# Patient Record
Sex: Male | Born: 1975 | Race: White | Hispanic: No | Marital: Single | Smoking: Current every day smoker
Health system: Southern US, Community
[De-identification: ages and names within clinical notes are randomized; demographics above are authoritative.]

## PROBLEM LIST (undated history)

## (undated) HISTORY — PX: WISDOM TOOTH EXTRACTION: SHX21

---

## 2016-06-16 ENCOUNTER — Emergency Department (HOSPITAL_COMMUNITY)
Admission: EM | Admit: 2016-06-16 | Discharge: 2016-06-17 | Disposition: A | Discharge status: Home / Self Care | Payer: Self-pay | Attending: Emergency Medicine | Admitting: Emergency Medicine

## 2016-06-16 ENCOUNTER — Encounter (HOSPITAL_COMMUNITY): Payer: Self-pay | Admitting: Emergency Medicine

## 2016-06-16 DIAGNOSIS — R229 Localized swelling, mass and lump, unspecified: Secondary | ICD-10-CM

## 2016-06-16 DIAGNOSIS — IMO0002 Reserved for concepts with insufficient information to code with codable children: Secondary | ICD-10-CM

## 2016-06-16 DIAGNOSIS — Z79899 Other long term (current) drug therapy: Secondary | ICD-10-CM | POA: Insufficient documentation

## 2016-06-16 DIAGNOSIS — N3001 Acute cystitis with hematuria: Secondary | ICD-10-CM | POA: Insufficient documentation

## 2016-06-16 DIAGNOSIS — N4400 Torsion of testis, unspecified: Secondary | ICD-10-CM | POA: Insufficient documentation

## 2016-06-16 DIAGNOSIS — F17200 Nicotine dependence, unspecified, uncomplicated: Secondary | ICD-10-CM | POA: Insufficient documentation

## 2016-06-16 DIAGNOSIS — N451 Epididymitis: Secondary | ICD-10-CM | POA: Insufficient documentation

## 2016-06-16 MED ORDER — SODIUM CHLORIDE 0.9 % IV SOLN
INTRAVENOUS | Status: DC
  Administered 2016-06-17: 03:00:00 via INTRAVENOUS

## 2016-06-16 MED ORDER — HYDROMORPHONE HCL 2 MG/ML IJ SOLN
1.0000 mg | Freq: Once | INTRAMUSCULAR | Status: AC
  Administered 2016-06-17: 1 mg via INTRAVENOUS
  Filled 2016-06-16: qty 1

## 2016-06-16 NOTE — ED Provider Notes (Signed)
TIME SEEN:  By signing my name below, I, Scott Carrillo, attest that this documentation has been prepared under the direction and in the presence of [employer]bridge EnergyKristen N Shed Nixon, DO.  Electronically Signed: Octavia HeirArianna Carrillo, ED Scribe. 06/17/16. 12:16 AM.   CHIEF COMPLAINT:  Chief Complaint  Patient presents with  . Groin Swelling  . Testicle Pain     HPI:  HPI Comments: Scott Carrillo is a 40 y.o. male brought in by ambulance, who presents to the Emergency Department complaining of gradual worsening, moderate, right testicular pain and swelling x 3 days with gradual worsening symptoms for the past 48 hours. Pt reports associated loss of appetite and mild diarrhea. He has right testicular erythema and hardening. Pt notes no pain, swelling, or erythema to his right testicle.  No injury to this area. He reports having slight pain Monday and ~ 6 pm he noticed the swelling. He says taking a hot bath yesterday which eased his pain minimally enough for him to go to sleep. Pt was [facility] at [facility], told he may possibly have an incarcerated hernia, and [facility]. Pt has no abdominal surgical hx. He denies fever, chills, nausea, or vomiting.   ROS: See HPI Constitutional: no fever  Eyes: no drainage  ENT: no runny nose   Cardiovascular:  no chest pain  Resp: no SOB  GI: no vomiting GU: no dysuria Integumentary: no rash  Allergy: no hives  Musculoskeletal: no leg swelling  Neurological: no slurred speech ROS otherwise negative  PAST MEDICAL HISTORY/PAST SURGICAL HISTORY:  History reviewed. No pertinent past medical history.  MEDICATIONS:  Prior to Admission medications   Not on File    ALLERGIES:  Allergies  Allergen Reactions  . Penicillins     Anaphylaxis     SOCIAL HISTORY:  Social History  Substance Use Topics  . Smoking status: Current Every Day Smoker    Packs/day: 1.00  . Smokeless tobacco: Never Used  . Alcohol use Not on file    FAMILY HISTORY: No family  history on file.  EXAM: BP 120/77 (BP Location: Left Arm)   Pulse 80   Temp 98.1 F (36.7 C) (Oral)   Resp 16   SpO2 99%  CONSTITUTIONAL: Alert and oriented and responds appropriately to questions. Well-appearing; well-nourished HEAD: Normocephalic EYES: Conjunctivae clear, PERRL, EOMI ENT: normal nose; no rhinorrhea; moist mucous membranes NECK: Supple, no meningismus, no nuchal rigidity, no LAD  CARD: RRR; S1 and S2 appreciated; no murmurs, no clicks, no rubs, no gallops RESP: Normal chest excursion without splinting or tachypnea; breath sounds clear and equal bilaterally; no wheezes, no rhonchi, no rales, no hypoxia or respiratory distress, speaking full sentences ABD/GI: Normal bowel sounds; non-distended; soft, non-tender, no rebound, no guarding, no peritoneal signs, no hepatosplenomegaly GU:  Normal external genitalia, circumcised male, normal penile shaft, no blood or discharge at the urethral meatus.  Patient has right testicular swelling with tenderness and a high riding testicle on the right side with no cremasteric reflex present. There is no left testicular masses or tenderness on exam, no left scrotal masses or swelling, no hernias appreciated, 2+ femoral pulses bilaterally; no perineal erythema, warmth, subcutaneous air or crepitus; no high riding testicle, normal left cremasteric reflex.  Chaperone present for exam. BACK:  The back appears normal and is non-tender to palpation, there is no CVA tenderness EXT: Normal ROM in all joints; non-tender to palpation; no edema; normal capillary refill; no cyanosis, no calf tenderness or swelling    SKIN:  Normal color for age and race; warm; no rash NEURO: Moves all extremities equally, sensation to light touch intact diffusely, cranial nerves II through XII intact, normal speech PSYCH: The patient's mood and manner are appropriate. Grooming and personal hygiene are appropriate.  MEDICAL DECISION MAKING: I am concerned for testicular  torsion given patient's clinical exam. At this time I do not appreciate a hernia. Nothing to suggest Fournier's gangrene or necrotizing fasciitis. He is 48 hours out from onset of symptoms. Discussed with him that if this is testicular torsion that this testicle is likely nonsalvageable. He understands. We will keep him NPO, give IV fluids, pain medication. Will obtain a scrotal ultrasound with Doppler. Labs at outside hospital showed leukocytosis of 14,000 but otherwise unremarkable.  ED PROGRESS: Patient's labs show leukocytosis with left shift. His ultrasound shows no torsion but rather epididymitis. No bowel loops noted in the scrotal sac. He is sexually active. Denies history of previous STDs. Will send urinalysis, urine culture. We'll also obtain GC and chlamydia swab.   Urine shows blood, leukocytes and bacteria. Urine culture is pending. Will discharge on Keflex and doxycycline. We'll discharge with pain medication. Given outpatient urology follow-up. Patient reports he is from [name].   3:34 AM Pt states he is not feeling much pain currently. Pt is comfortable with plan and is stable for discharge at this time. Return precautions for return into the ED were discussed.     At this time, I do not feel there is any life-threatening condition present. I have reviewed and discussed all results (EKG, imaging, lab, urine as appropriate) and exam findings with patient/family. I have reviewed nursing notes and appropriate previous records.  I feel the patient is safe to be discharged home without further emergent workup and can continue workup as an outpatient as needed. Discussed usual and customary return precautions. Patient/family verbalize understanding and are comfortable with this plan.  Outpatient follow-up has been provided. All questions have been answered.  I personally performed the services described in this documentation, which was scribed in my presence. The recorded information has been  reviewed and is accurate.    Scott MawKristen N Deborrah Mabin, DO 06/17/16 [phone]

## 2016-06-16 NOTE — ED Triage Notes (Signed)
Pt started having scrotal swelling and pain on Monday. Relieved with hot baths. Then this morning felt "10 time worse".

## 2016-06-16 NOTE — ED Triage Notes (Signed)
Was seen at [facility] and referred here

## 2016-06-17 ENCOUNTER — Emergency Department (HOSPITAL_COMMUNITY): Payer: Self-pay

## 2016-06-17 LAB — URINALYSIS, ROUTINE W REFLEX MICROSCOPIC
Bilirubin Urine: NEGATIVE
GLUCOSE, UA: NEGATIVE mg/dL
KETONES UR: 15 mg/dL — AB
NITRITE: NEGATIVE
PROTEIN: 30 mg/dL — AB
Specific Gravity, Urine: 1.019 (ref 1.005–1.030)
pH: 5.5 (ref 5.0–8.0)

## 2016-06-17 LAB — BASIC METABOLIC PANEL
ANION GAP: 6 (ref 5–15)
BUN: 6 mg/dL (ref 6–20)
CALCIUM: 8.8 mg/dL — AB (ref 8.9–10.3)
CO2: 21 mmol/L — AB (ref 22–32)
Chloride: 107 mmol/L (ref 101–111)
Creatinine, Ser: 0.84 mg/dL (ref 0.61–1.24)
GLUCOSE: 104 mg/dL — AB (ref 65–99)
POTASSIUM: 3.4 mmol/L — AB (ref 3.5–5.1)
Sodium: 134 mmol/L — ABNORMAL LOW (ref 135–145)

## 2016-06-17 LAB — CBC WITH DIFFERENTIAL/PLATELET
BASOS ABS: 0 10*3/uL (ref 0.0–0.1)
BASOS PCT: 0 %
Eosinophils Absolute: 0 10*3/uL (ref 0.0–0.7)
Eosinophils Relative: 0 %
HEMATOCRIT: 39.8 % (ref 39.0–52.0)
Hemoglobin: 13.4 g/dL (ref 13.0–17.0)
LYMPHS PCT: 22 %
Lymphs Abs: 2.9 10*3/uL (ref 0.7–4.0)
MCH: 30.5 pg (ref 26.0–34.0)
MCHC: 33.7 g/dL (ref 30.0–36.0)
MCV: 90.7 fL (ref 78.0–100.0)
Monocytes Absolute: 1.4 10*3/uL — ABNORMAL HIGH (ref 0.1–1.0)
Monocytes Relative: 11 %
NEUTROS ABS: 8.7 10*3/uL — AB (ref 1.7–7.7)
NEUTROS PCT: 67 %
Platelets: 229 10*3/uL (ref 150–400)
RBC: 4.39 MIL/uL (ref 4.22–5.81)
RDW: 13.3 % (ref 11.5–15.5)
WBC: 13.1 10*3/uL — AB (ref 4.0–10.5)

## 2016-06-17 LAB — GC/CHLAMYDIA PROBE AMP (~~LOC~~) NOT AT ARMC
Chlamydia: POSITIVE — AB
NEISSERIA GONORRHEA: NEGATIVE

## 2016-06-17 LAB — URINE MICROSCOPIC-ADD ON

## 2016-06-17 MED ORDER — DOXYCYCLINE HYCLATE 100 MG PO TABS
100.0000 mg | ORAL_TABLET | Freq: Once | ORAL | Status: AC
  Administered 2016-06-17: 100 mg via ORAL
  Filled 2016-06-17: qty 1

## 2016-06-17 MED ORDER — OXYCODONE-ACETAMINOPHEN 5-325 MG PO TABS
1.0000 | ORAL_TABLET | ORAL | 0 refills | Status: DC | PRN
Start: 2016-06-17 — End: 2021-03-20

## 2016-06-17 MED ORDER — HYDROMORPHONE HCL 2 MG/ML IJ SOLN
1.0000 mg | Freq: Once | INTRAMUSCULAR | Status: AC
  Administered 2016-06-17: 1 mg via INTRAVENOUS
  Filled 2016-06-17: qty 1

## 2016-06-17 MED ORDER — PROMETHAZINE HCL 25 MG PO TABS: 25.0000 mg | ORAL_TABLET | Freq: Four times a day (QID) | ORAL | 0 refills | Status: DC | PRN

## 2016-06-17 MED ORDER — CEPHALEXIN 500 MG PO CAPS: 500.0000 mg | ORAL_CAPSULE | Freq: Two times a day (BID) | ORAL | 0 refills | Status: DC

## 2016-06-17 MED ORDER — DOXYCYCLINE HYCLATE 100 MG PO CAPS: 100.0000 mg | ORAL_CAPSULE | Freq: Two times a day (BID) | ORAL | 0 refills | Status: DC

## 2016-06-17 MED ORDER — DEXTROSE 5 % IV SOLN
1.0000 g | Freq: Once | INTRAVENOUS | Status: AC
  Administered 2016-06-17: 1 g via INTRAVENOUS
  Filled 2016-06-17: qty 10

## 2016-06-18 LAB — URINE CULTURE: Culture: <10000 — AB

## 2017-07-21 ENCOUNTER — Encounter: Payer: Self-pay | Admitting: Emergency Medicine

## 2017-07-21 ENCOUNTER — Emergency Department: Payer: Self-pay

## 2017-07-21 ENCOUNTER — Emergency Department
Admission: EM | Admit: 2017-07-21 | Discharge: 2017-07-21 | Disposition: A | Discharge status: Home / Self Care | Payer: Self-pay | Attending: Emergency Medicine | Admitting: Emergency Medicine

## 2017-07-21 DIAGNOSIS — F17210 Nicotine dependence, cigarettes, uncomplicated: Secondary | ICD-10-CM | POA: Insufficient documentation

## 2017-07-21 DIAGNOSIS — R05 Cough: Secondary | ICD-10-CM | POA: Insufficient documentation

## 2017-07-21 DIAGNOSIS — N390 Urinary tract infection, site not specified: Secondary | ICD-10-CM | POA: Insufficient documentation

## 2017-07-21 DIAGNOSIS — R509 Fever, unspecified: Secondary | ICD-10-CM | POA: Insufficient documentation

## 2017-07-21 DIAGNOSIS — M545 Low back pain: Secondary | ICD-10-CM | POA: Insufficient documentation

## 2017-07-21 LAB — URINALYSIS, COMPLETE (UACMP) WITH MICROSCOPIC
Bilirubin Urine: NEGATIVE
GLUCOSE, UA: NEGATIVE mg/dL
Hgb urine dipstick: NEGATIVE
Ketones, ur: NEGATIVE mg/dL
Nitrite: NEGATIVE
Protein, ur: 30 mg/dL — AB
SPECIFIC GRAVITY, URINE: 1.023 (ref 1.005–1.030)
pH: 6 (ref 5.0–8.0)

## 2017-07-21 LAB — BASIC METABOLIC PANEL
ANION GAP: 9 (ref 5–15)
BUN: 7 mg/dL (ref 6–20)
CO2: 22 mmol/L (ref 22–32)
Calcium: 9.4 mg/dL (ref 8.9–10.3)
Chloride: 107 mmol/L (ref 101–111)
Creatinine, Ser: 0.81 mg/dL (ref 0.61–1.24)
GFR calc non Af Amer: >60 mL/min (ref >60)
GLUCOSE: 116 mg/dL — AB (ref 65–99)
POTASSIUM: 4.3 mmol/L (ref 3.5–5.1)
Sodium: 138 mmol/L (ref 135–145)

## 2017-07-21 LAB — CBC
HEMATOCRIT: 46.3 % (ref 40.0–52.0)
HEMOGLOBIN: 15.7 g/dL (ref 13.0–18.0)
MCH: 30.7 pg (ref 26.0–34.0)
MCHC: 33.9 g/dL (ref 32.0–36.0)
MCV: 90.6 fL (ref 80.0–100.0)
Platelets: 232 10*3/uL (ref 150–440)
RBC: 5.11 MIL/uL (ref 4.40–5.90)
RDW: 13.9 % (ref 11.5–14.5)
WBC: 5.7 10*3/uL (ref 3.8–10.6)

## 2017-07-21 LAB — CHLAMYDIA/NGC RT PCR (ARMC ONLY)
CHLAMYDIA TR: NOT DETECTED
N gonorrhoeae: NOT DETECTED

## 2017-07-21 MED ORDER — LEVOFLOXACIN 500 MG PO TABS: 500.0000 mg | ORAL_TABLET | Freq: Every day | ORAL | 0 refills | Status: AC

## 2017-07-21 MED ORDER — AZITHROMYCIN 500 MG PO TABS
1000.0000 mg | ORAL_TABLET | Freq: Once | ORAL | Status: AC
  Administered 2017-07-21: 1000 mg via ORAL
  Filled 2017-07-21: qty 2

## 2017-07-21 NOTE — Discharge Instructions (Signed)
As we discussed please have any sexual partners tested and treated if the results are positive.  Please take your antibiotics as prescribed for the next 7 days.  Return to the emergency department for any significant worsening of pain, vomiting unable to keep down her antibiotics or development of fever.

## 2017-07-21 NOTE — ED Triage Notes (Addendum)
Pt comes into the ED via POV c/o cough, congestion, and body aches that have been going on for about a week and half.  Denies any chest pain, N/V, or dizziness.  Patient denies getting a flu shot this year.  Patient states the highest his fever at home was 103.  Patient has even and unlabored respirations and is ambulatory to triage at this time.  Patient also states he has lower back pain that is causing difficulty urinating.  Patient believes he has a kidney infection.

## 2017-07-21 NOTE — ED Provider Notes (Signed)
[facility] Emergency Department Provider Note  Time seen: 2:52 PM  I have reviewed the triage vital signs and the nursing notes.   HISTORY  Chief Complaint Cough and Back Pain    HPI Scott Carrillo is a 41 y.o. male with no past medical history who presents to the emergency department for 1 week of cough, congestion, fever now with lower back pain and dysuria.  According to the patient for the past 1 week he has been expensing cough, congestion fever at times at home.  He states over the past 3 days he is also been experiencing dysuria with mild penile discharge.  States he has experienced mild to moderate lower back pain as well.  Patient states his pain is minimal at this time.  Denies any vomiting or diarrhea.  Largely negative review of systems otherwise.  History reviewed. No pertinent past medical history.  There are no active problems to display for this patient.   Past Surgical History:  Procedure Laterality Date  . WISDOM TOOTH EXTRACTION      Prior to Admission medications   Medication Sig Start Date End Date Taking? Authorizing Provider  cephALEXin (KEFLEX) 500 MG capsule Take 1 capsule (500 mg total) by mouth 2 (two) times daily. 06/17/16   Ward, Layla Maw, DO  doxycycline (VIBRAMYCIN) 100 MG capsule Take 1 capsule (100 mg total) by mouth 2 (two) times daily. 06/17/16   Ward, Layla Maw, DO  oxyCODONE-acetaminophen (PERCOCET/ROXICET) 5-325 MG tablet Take 1 tablet by mouth every 4 (four) hours as needed. 06/17/16   Ward, Layla Maw, DO  promethazine (PHENERGAN) 25 MG tablet Take 1 tablet (25 mg total) by mouth every 6 (six) hours as needed for nausea or vomiting. 06/17/16   Ward, Layla Maw, DO    Allergies  Allergen Reactions  . Penicillins     Anaphylaxis     No family history on file.  Social History Social History   Tobacco Use  . Smoking status: Current Every Day Smoker    Packs/day: 1.00  . Smokeless tobacco: Never Used  Substance  Use Topics  . Alcohol use: No    Frequency: Never  . Drug use: Yes    Types: Marijuana    Comment: rare    Review of Systems Constitutional: Positive for fever over the past 1 week per patient. Eyes: Negative for visual complaints. ENT: Mild congestion Cardiovascular: Negative for chest pain. Respiratory: Negative for shortness of breath.  Positive for cough Gastrointestinal: Negative for abdominal pain, vomiting and diarrhea. Genitourinary: Positive for dysuria with mild penile discharge. Musculoskeletal: Mild to moderate lower back pain bilaterally. Skin: Negative for rash. Neurological: Negative for headache All other ROS negative  ____________________________________________   PHYSICAL EXAM:  VITAL SIGNS: ED Triage Vitals  Enc Vitals Group     BP 07/21/17 1238 125/63     Pulse Rate 07/21/17 1238 75     Resp 07/21/17 1238 18     Temp 07/21/17 1238 98.4 F (36.9 C)     Temp Source 07/21/17 1238 Oral     SpO2 07/21/17 1238 97 %     Weight 07/21/17 1139 160 lb (72.6 kg)     Height 07/21/17 1139 5\' 3"  (1.6 m)     Head Circumference --      Peak Flow --      Pain Score 07/21/17 1139 9     Pain Loc --      Pain Edu? --      Excl.  in GC? --     Constitutional: Alert and oriented. Well appearing and in no distress. Eyes: Normal exam ENT   Head: Normocephalic and atraumatic   Mouth/Throat: Mucous membranes are moist. Cardiovascular: Normal rate, regular rhythm. No murmur Respiratory: Normal respiratory effort without tachypnea nor retractions. Breath sounds are clear  Gastrointestinal: Soft and nontender. No distention.   Musculoskeletal: Nontender with normal range of motion in all extremities. Neurologic:  Normal speech and language. No gross focal neurologic deficits  Skin:  Skin is warm, dry and intact.  Psychiatric: Mood and affect are normal.   ____________________________________________    RADIOLOGY  X-ray  negative  ____________________________________________   INITIAL IMPRESSION / ASSESSMENT AND PLAN / ED COURSE  Pertinent labs & imaging results that were available during my care of the patient were reviewed by me and considered in my medical decision making (see chart for details).  Patient presents to the emergency department for 1 week of cough, congestion, fever now with 3 days of dysuria penile discharge lower back pain.  Differential would include upper respiratory infection, viral illness, influenza, urinary tract infection, STD, pyelonephritis.  Patient's labs have resulted showing a significant urinary tract infection with too numerous to count white blood cells as well as many bacteria.  Patient states proximate 6 months ago he was diagnosed with an STD.  Record review Shows he was diagnosed with chlamydia.  We will cover with azithromycin in the emergency department, 1 g.  Patient agreeable to plan.  Patient has an anaphylactic penicillin allergy we will hold off on coverage for gonorrhea until we know the results of his STD testing.  I discussed with the patient the need to have his partner tested and treated as well and have 7 days elapsed once both partners are treated before any sexual activity.  Patient agreeable.  I also discussed given the patient's complaints of back pain into numerous to count white cells we obtain an IV for antibiotics.  Patient states unfortunately he needs to leave the emergency department to pick up his girlfriend.  We will cover with Levaquin to cover for possible urinary tract infection in addition to STD testing.  Patient agreeable to plan of care.  ____________________________________________   FINAL CLINICAL IMPRESSION(S) / ED DIAGNOSES  Urinary tract infection    Minna AntisPaduchowski, Norina Cowper, MD 07/21/17 1455

## 2017-07-22 NOTE — ED Notes (Addendum)
Patient called patient experience to express dissatisfaction R/T cost of Levaquin that prescribed at last visit.  Reviewed patient's record with Dr. Pershing ProudSchaevitz and Macrobid 100 mg PO BID #14 ordered.  Patient called.  Apologized for inconvenience.  Explained to patient that although I was unsure as to actual cost of Macrobid, I had found information that a typical RX was around $20 cost.  Teaching given r/t taking Macrobid.  Understanding verbalized.  Patient stated he was satisfied with resolution.  [facility] in [place] ([phone]) called and Macrobid RX called in.

## 2017-07-23 LAB — URINE CULTURE

## 2018-07-10 IMAGING — CR DG CHEST 2V
1 series · 2 of 2 positions shown · non-contrast
Comparison: None.

CLINICAL DATA: Productive cough

EXAM:
CHEST  2 VIEW

[Series 1: dg chest 2 view · 0.14mm/px · 2 of 2 slices shown]
[im 1/2]
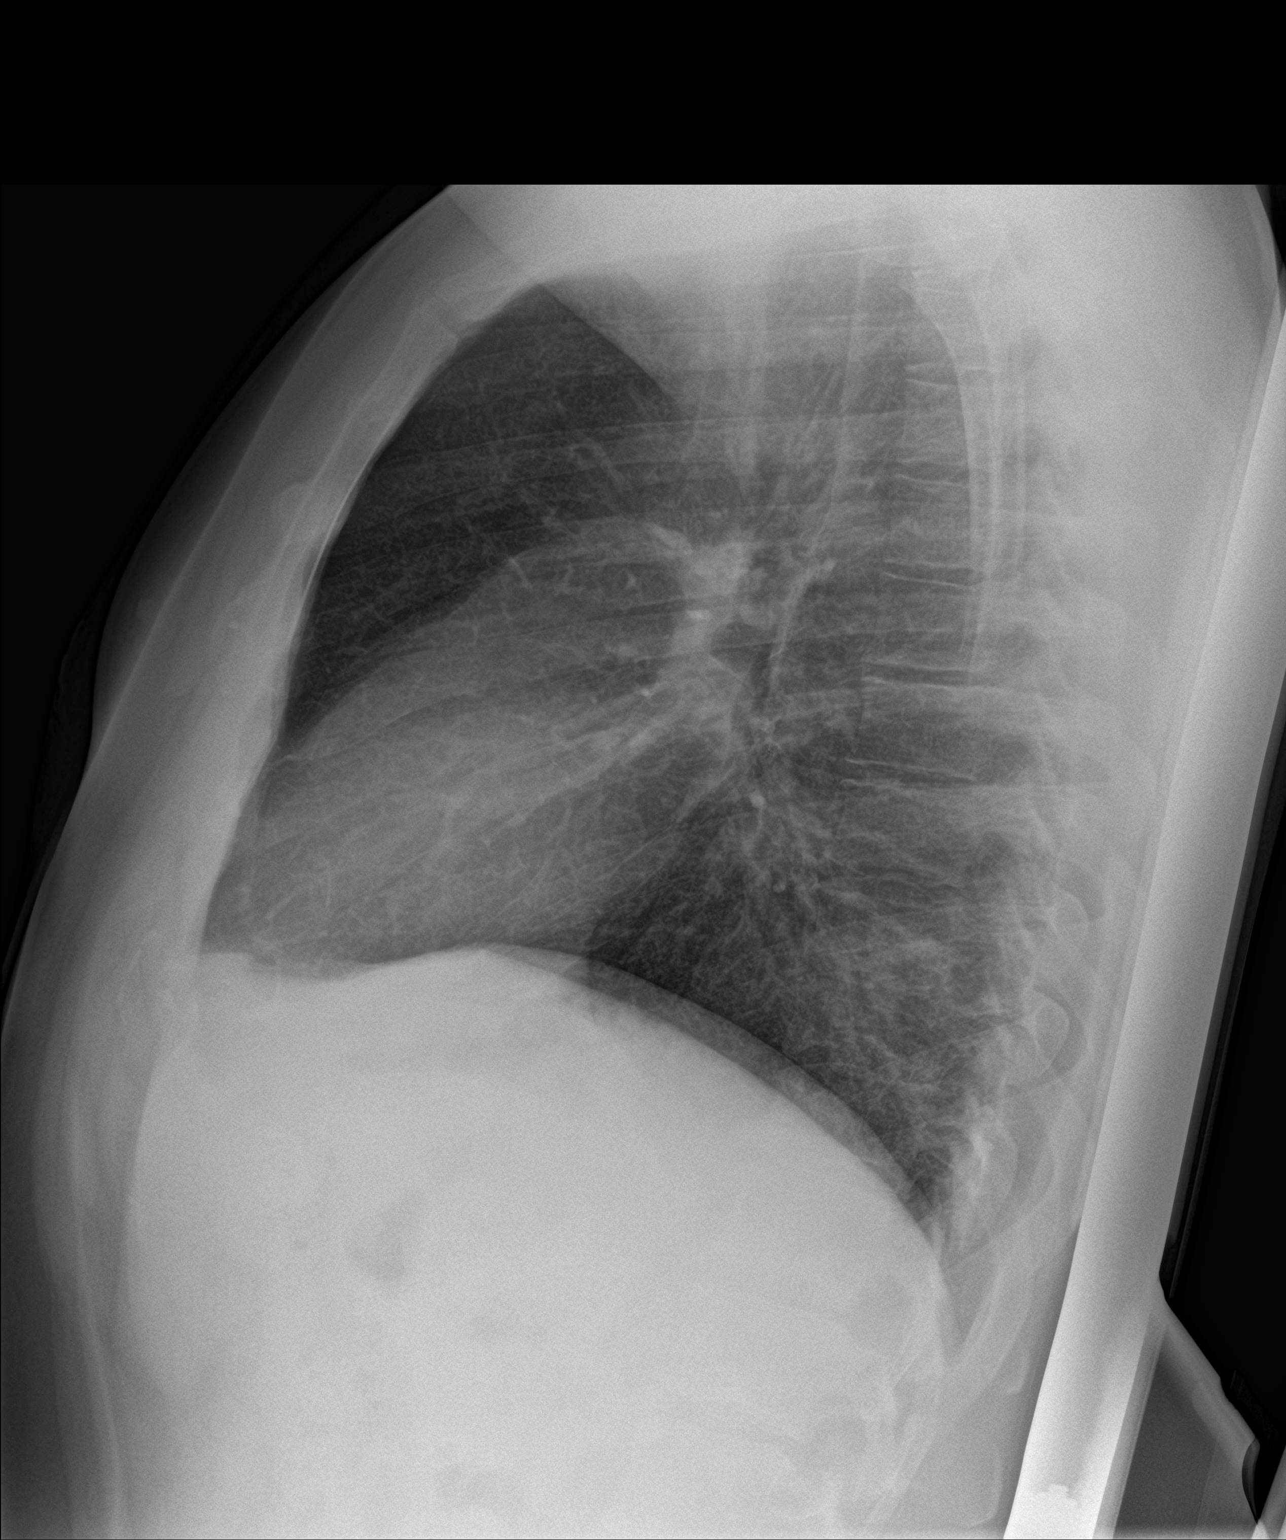
[im 2/2]
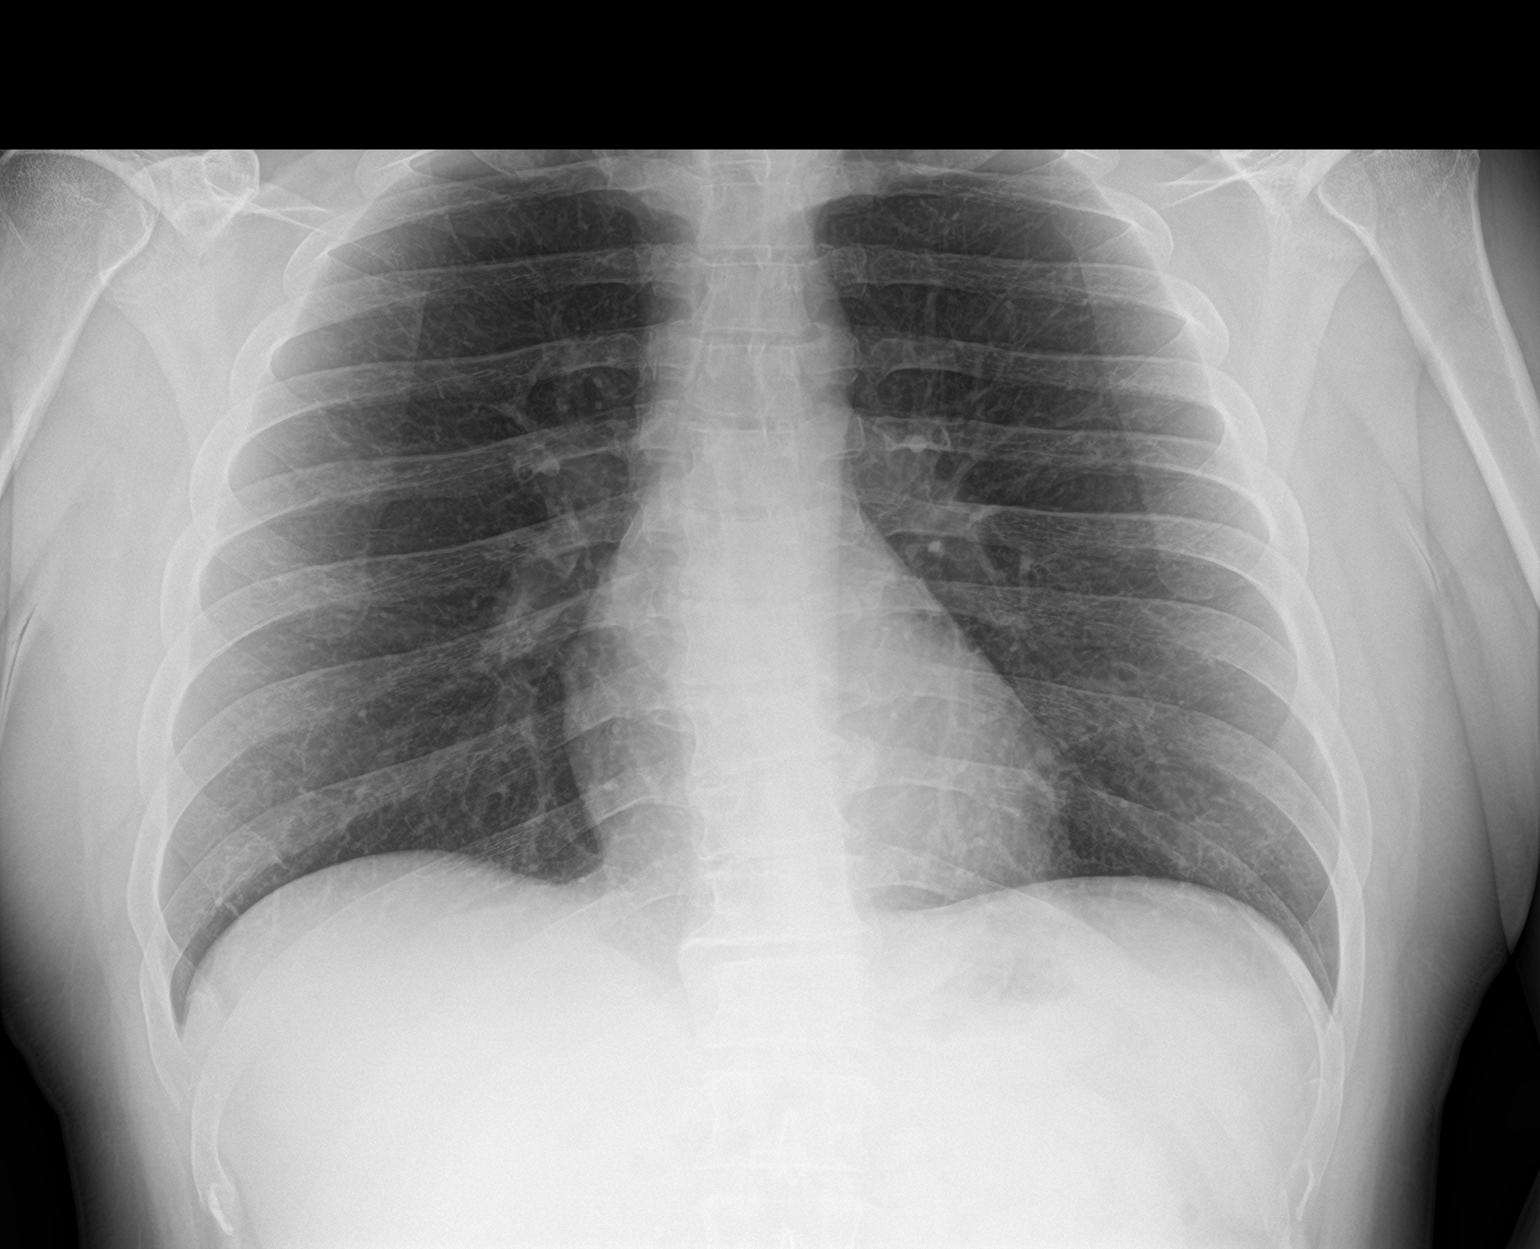

[2 of 2 positions shown; findings below may reference images not displayed]

FINDINGS: Heart and mediastinal contours are within normal limits. No focal
opacities or effusions. No acute bony abnormality.
IMPRESSION: No active cardiopulmonary disease.

## 2021-03-20 ENCOUNTER — Other Ambulatory Visit: Payer: Self-pay

## 2021-03-20 ENCOUNTER — Emergency Department (HOSPITAL_COMMUNITY)
Admission: EM | Admit: 2021-03-20 | Discharge: 2021-03-20 | Disposition: A | Discharge status: Home / Self Care | Payer: Self-pay | Attending: Emergency Medicine | Admitting: Emergency Medicine

## 2021-03-20 ENCOUNTER — Encounter (HOSPITAL_COMMUNITY): Payer: Self-pay

## 2021-03-20 DIAGNOSIS — K047 Periapical abscess without sinus: Secondary | ICD-10-CM | POA: Insufficient documentation

## 2021-03-20 DIAGNOSIS — F17210 Nicotine dependence, cigarettes, uncomplicated: Secondary | ICD-10-CM | POA: Insufficient documentation

## 2021-03-20 MED ORDER — MELOXICAM 15 MG PO TABS: 15.0000 mg | ORAL_TABLET | Freq: Every day | ORAL | 0 refills | Status: AC

## 2021-03-20 MED ORDER — CLINDAMYCIN HCL 300 MG PO CAPS: 300.0000 mg | ORAL_CAPSULE | Freq: Four times a day (QID) | ORAL | 0 refills | Status: AC

## 2021-03-20 NOTE — Discharge Instructions (Addendum)
Rinse with listerine after every meal. Take antibiotics as prescribed and complete the full course. Take pain medication as needed as prescribed. Follow up with a dentist as soon as possible.

## 2021-03-20 NOTE — ED Triage Notes (Signed)
Pt reports left upper dental pain and abscess. Pt states he has 2 chipped teeth in that area, using warm compresses for swelling.

## 2021-03-20 NOTE — ED Provider Notes (Signed)
[name] EMERGENCY DEPARTMENT Provider Note   CSN: [id number] Arrival date & time: 03/20/21  9211     History Chief Complaint  Patient presents with   Abscess    Scott Carrillo is a 45 y.o. male.  45 year old male presents with concern for dental abscess.  Patient reports having a tooth that was going bad in his left upper mouth, 8 croutons at a restaurant a few days ago which cracked his teeth, and now with pain and swelling to the left upper jaw with swelling extending below his left eye.  Denies fevers, chills, trauma, drainage.  No other complaints or concerns.  Patient tried calling a dentist today however due to today being Friday and a holiday weekend is unable to be seen.      History reviewed. No pertinent past medical history.  There are no problems to display for this patient.   Past Surgical History:  Procedure Laterality Date   WISDOM TOOTH EXTRACTION         No family history on file.  Social History   Tobacco Use   Smoking status: Every Day    Packs/day: 1.00    Types: Cigarettes   Smokeless tobacco: Never  Substance Use Topics   Alcohol use: No   Drug use: Yes    Types: Marijuana    Comment: rare    Home Medications Prior to Admission medications   Medication Sig Start Date End Date Taking? Authorizing Provider  clindamycin (CLEOCIN) 300 MG capsule Take 1 capsule (300 mg total) by mouth 4 (four) times daily for 10 days. 03/20/21 03/30/21 Yes Jeannie Fend, PA-C  meloxicam (MOBIC) 15 MG tablet Take 1 tablet (15 mg total) by mouth daily for 10 days. 03/20/21 03/30/21 Yes Jeannie Fend, PA-C    Allergies    Penicillins  Review of Systems   Review of Systems  Constitutional:  Negative for fever.  HENT:  Positive for dental problem and facial swelling. Negative for ear pain, trouble swallowing and voice change.   Gastrointestinal:  Negative for nausea and vomiting.  Musculoskeletal:  Negative for neck pain.  Skin:  Negative  for rash and wound.  Allergic/Immunologic: Negative for immunocompromised state.  Neurological:  Negative for headaches.  Hematological:  Negative for adenopathy.  Psychiatric/Behavioral:  Negative for confusion.   All other systems reviewed and are negative.  Physical Exam Updated Vital Signs BP (!) 142/87   Pulse 91   Temp 98.3 F (36.8 C) (Oral)   Resp 16   Ht 5\' 3"  (1.6 m)   Wt 80.3 kg   SpO2 99%   BMI 31.35 kg/m   Physical Exam Vitals and nursing note reviewed.  Constitutional:      General: He is not in acute distress.    Appearance: He is well-developed. He is not diaphoretic.  HENT:     Head: Atraumatic.     Jaw: No trismus.      Nose: Nose normal.     Mouth/Throat:     Mouth: Mucous membranes are moist.     Dentition: Gingival swelling and dental abscesses present.   Eyes:     Extraocular Movements: Extraocular movements intact.     Conjunctiva/sclera: Conjunctivae normal.     Pupils: Pupils are equal, round, and reactive to light.  Pulmonary:     Effort: Pulmonary effort is normal.  Musculoskeletal:     Cervical back: Neck supple.  Lymphadenopathy:     Cervical: No cervical adenopathy.  Skin:  General: Skin is warm and dry.     Findings: No erythema or rash.  Neurological:     Mental Status: He is alert and oriented to person, place, and time.  Psychiatric:        Behavior: Behavior normal.    ED Results / Procedures / Treatments   Labs (all labs ordered are listed, but only abnormal results are displayed) Labs Reviewed - No data to display  EKG None  Radiology No results found.  Procedures Procedures   Medications Ordered in ED Medications - No data to display  ED Course  I have reviewed the triage vital signs and the nursing notes.  Pertinent labs & imaging results that were available during my care of the patient were reviewed by me and considered in my medical decision making (see chart for details).  Clinical Course as of  03/20/21 1050  Fri Mar 20, 2021  1049 45 yo male with left upper dental abscess as above. No trismus, no facial cellulitis, no drainable collection present today.  Recommend warm compresses, antibiotics, listerine rise. Follow up with dentist as soon as possible, given referral as well as resource list. [LM]    Clinical Course User Index [LM] Alden Hipp   MDM Rules/Calculators/A&P                           Final Clinical Impression(s) / ED Diagnoses Final diagnoses:  Dental abscess    Rx / DC Orders ED Discharge Orders          Ordered    clindamycin (CLEOCIN) 300 MG capsule  4 times daily        03/20/21 1014    meloxicam (MOBIC) 15 MG tablet  Daily        03/20/21 1014             Alden Hipp 03/20/21 1050    Terald Sleeper, MD 03/20/21 1341

## 2021-09-08 ENCOUNTER — Other Ambulatory Visit: Payer: Self-pay

## 2021-09-08 ENCOUNTER — Emergency Department (HOSPITAL_COMMUNITY)
Admission: EM | Admit: 2021-09-08 | Discharge: 2021-09-09 | Disposition: A | Discharge status: Home / Self Care | Payer: Self-pay | Attending: Emergency Medicine | Admitting: Emergency Medicine

## 2021-09-08 ENCOUNTER — Emergency Department (HOSPITAL_COMMUNITY): Payer: Self-pay

## 2021-09-08 ENCOUNTER — Encounter (HOSPITAL_COMMUNITY): Payer: Self-pay | Admitting: Emergency Medicine

## 2021-09-08 DIAGNOSIS — M549 Dorsalgia, unspecified: Secondary | ICD-10-CM | POA: Insufficient documentation

## 2021-09-08 DIAGNOSIS — R0789 Other chest pain: Secondary | ICD-10-CM | POA: Insufficient documentation

## 2021-09-08 LAB — CBC WITH DIFFERENTIAL/PLATELET
Abs Immature Granulocytes: 0.07 10*3/uL (ref 0.00–0.07)
Basophils Absolute: 0.1 10*3/uL (ref 0.0–0.1)
Basophils Relative: 1 %
Eosinophils Absolute: 0.1 10*3/uL (ref 0.0–0.5)
Eosinophils Relative: 2 %
HCT: 47.3 % (ref 39.0–52.0)
Hemoglobin: 15.9 g/dL (ref 13.0–17.0)
Immature Granulocytes: 1 %
Lymphocytes Relative: 43 %
Lymphs Abs: 3.4 10*3/uL (ref 0.7–4.0)
MCH: 30.7 pg (ref 26.0–34.0)
MCHC: 33.6 g/dL (ref 30.0–36.0)
MCV: 91.3 fL (ref 80.0–100.0)
Monocytes Absolute: 0.6 10*3/uL (ref 0.1–1.0)
Monocytes Relative: 8 %
Neutro Abs: 3.7 10*3/uL (ref 1.7–7.7)
Neutrophils Relative %: 45 %
Platelets: 291 10*3/uL (ref 150–400)
RBC: 5.18 MIL/uL (ref 4.22–5.81)
RDW: 13.1 % (ref 11.5–15.5)
WBC: 8 10*3/uL (ref 4.0–10.5)
nRBC: 0 % (ref 0.0–0.2)

## 2021-09-08 LAB — COMPREHENSIVE METABOLIC PANEL
ALT: 15 U/L (ref 0–44)
AST: 16 U/L (ref 15–41)
Albumin: 4 g/dL (ref 3.5–5.0)
Alkaline Phosphatase: 57 U/L (ref 38–126)
Anion gap: 10 (ref 5–15)
BUN: 8 mg/dL (ref 6–20)
CO2: 21 mmol/L — ABNORMAL LOW (ref 22–32)
Calcium: 9.3 mg/dL (ref 8.9–10.3)
Chloride: 104 mmol/L (ref 98–111)
Creatinine, Ser: 0.78 mg/dL (ref 0.61–1.24)
GFR, Estimated: >60 mL/min (ref >60)
Glucose, Bld: 92 mg/dL (ref 70–99)
Potassium: 4 mmol/L (ref 3.5–5.1)
Sodium: 135 mmol/L (ref 135–145)
Total Bilirubin: 0.3 mg/dL (ref 0.3–1.2)
Total Protein: 7.3 g/dL (ref 6.5–8.1)

## 2021-09-08 LAB — URINALYSIS, ROUTINE W REFLEX MICROSCOPIC
Bilirubin Urine: NEGATIVE
Glucose, UA: NEGATIVE mg/dL
Hgb urine dipstick: NEGATIVE
Ketones, ur: NEGATIVE mg/dL
Nitrite: NEGATIVE
Protein, ur: NEGATIVE mg/dL
Specific Gravity, Urine: 1.012 (ref 1.005–1.030)
pH: 6 (ref 5.0–8.0)

## 2021-09-08 LAB — LIPASE, BLOOD: Lipase: 63 U/L — ABNORMAL HIGH (ref 11–51)

## 2021-09-08 LAB — TROPONIN I (HIGH SENSITIVITY)
Troponin I (High Sensitivity): 4 ng/L (ref <18)
Troponin I (High Sensitivity): 5 ng/L (ref <18)

## 2021-09-08 LAB — D-DIMER, QUANTITATIVE: D-Dimer, Quant: 0.36 ug/mL-FEU (ref 0.00–0.50)

## 2021-09-08 MED ORDER — HYDROMORPHONE HCL 1 MG/ML IJ SOLN
1.0000 mg | Freq: Once | INTRAMUSCULAR | Status: AC
  Administered 2021-09-08: 1 mg via INTRAVENOUS
  Filled 2021-09-08: qty 1

## 2021-09-08 MED ORDER — KETOROLAC TROMETHAMINE 30 MG/ML IJ SOLN
15.0000 mg | Freq: Once | INTRAMUSCULAR | Status: AC
  Administered 2021-09-08: 15 mg via INTRAVENOUS
  Filled 2021-09-08: qty 1

## 2021-09-08 MED ORDER — ONDANSETRON HCL 4 MG/2ML IJ SOLN
4.0000 mg | Freq: Once | INTRAMUSCULAR | Status: AC
  Administered 2021-09-08: 4 mg via INTRAVENOUS
  Filled 2021-09-08: qty 2

## 2021-09-08 NOTE — ED Triage Notes (Signed)
Patient reports chronic mid/right chest pain for 6 months radiating to right upper back , mild SOB , no emesis or diaphoresis , pain increases with movement/.changing positions .

## 2021-09-08 NOTE — ED Provider Triage Note (Signed)
Emergency Medicine Provider Triage Evaluation Note  Scott Carrillo , a 46 y.o. male  was evaluated in triage.  Pt complains of epigastric chest pain.  Patient states that he has had on and off chest pain for several years now, however is acutely worsened over the past couple of days.  He says the pain is located in his epigastric abdomen.  It is worse when he takes a deep breath.  It radiates to the back.  He denies any nausea or vomiting, however is p.o. intake is poor.  He states that he does not drink.  He has never had any gallbladder problems.  Denies any intra-abdominal surgery.  Denies cardiac problems.  Review of Systems  Positive: Chest pain, abdominal pain,  Negative:   Physical Exam  BP 107/82 (BP Location: Left Arm)    Pulse 68    Temp 98.4 F (36.9 C) (Oral)    Resp (!) 22    Ht 5\' 3"  (1.6 m)    Wt 85 kg    SpO2 99%    BMI 33.19 kg/m  Gen:   Awake, no distress   Resp:  Normal effort lungs with bilateral wheezing MSK:   Moves extremities without difficulty  Other:  There was significant epigastric and right upper quadrant tenderness to palpation.  Patient appears to be in discomfort.  Medical Decision Making  Medically screening exam initiated at 9:00 PM.  Appropriate orders placed.  was informed that the remainder of the evaluation will be completed by another provider, this initial triage assessment does not replace that evaluation, and the importance of remaining in the ED until their evaluation is complete.  I am more concerned that this is actually intra-abdominal presentation, however will rule out ACS with labs.    Wyn Quaker, Claudie Leach 09/08/21 2102

## 2021-09-08 NOTE — ED Provider Notes (Signed)
[facility] EMERGENCY DEPARTMENT Provider Note   CSN: [id number] Arrival date & time: 09/08/21  2045     History  Chief Complaint  Patient presents with   Chest Pain    Scott Carrillo is a 46 y.o. male.  Patient presents to the emergency department for evaluation of chest and back pain.  Patient reports that he has had this on and off for at least a year.  Usually it comes on and lasts for a few hours and then resolves.  He has had this pain continuously for 2 or 3 days.  It worsens if he lies flat or sits up straight, it is better if he lays on an angle.  There is pain with movement as well as taking deep breaths.      Home Medications Prior to Admission medications   Medication Sig Start Date End Date Taking? Authorizing Provider  ibuprofen (ADVIL) 800 MG tablet Take 1 tablet (800 mg total) by mouth every 6 (six) hours as needed for moderate pain. 09/09/21  Yes Danine Hor, Gwenyth Allegra, MD  omeprazole (PRILOSEC OTC) 20 MG tablet Take 20 mg by mouth daily as needed (heartburn).   Yes [provider]  oxyCODONE-acetaminophen (PERCOCET) 5-325 MG tablet Take 1-2 tablets by mouth every 4 (four) hours as needed. 09/09/21  Yes Larken Urias, Gwenyth Allegra, MD      Allergies    Chocolate, Penicillins, and Bee venom    Review of Systems   Review of Systems  Cardiovascular:  Positive for chest pain.  Musculoskeletal:  Positive for back pain.   Physical Exam Updated Vital Signs BP 129/72    Pulse (!) 56    Temp 98.4 F (36.9 C) (Oral)    Resp 13    Ht 5\' 3"  (1.6 m)    Wt 85 kg    SpO2 100%    BMI 33.19 kg/m  Physical Exam Vitals and nursing note reviewed.  Constitutional:      General: He is not in acute distress.    Appearance: He is well-developed.  HENT:     Head: Normocephalic and atraumatic.     Mouth/Throat:     Mouth: Mucous membranes are moist.  Eyes:     General: Vision grossly intact. Gaze aligned appropriately.     Extraocular Movements:  Extraocular movements intact.     Conjunctiva/sclera: Conjunctivae normal.  Cardiovascular:     Rate and Rhythm: Normal rate and regular rhythm.     Pulses: Normal pulses.     Heart sounds: Normal heart sounds, S1 normal and S2 normal. No murmur heard.   No friction rub. No gallop.  Pulmonary:     Effort: Pulmonary effort is normal. No respiratory distress.     Breath sounds: Normal breath sounds.  Chest:     Chest wall: Tenderness present. No deformity.    Abdominal:     Palpations: Abdomen is soft.     Tenderness: There is no abdominal tenderness. There is no guarding or rebound.     Hernia: No hernia is present.  Musculoskeletal:        General: No swelling.     Cervical back: Full passive range of motion without pain, normal range of motion and neck supple. No pain with movement, spinous process tenderness or muscular tenderness. Normal range of motion.     Thoracic back: Tenderness present. Decreased range of motion.     Right lower leg: No edema.     Left lower leg: No edema.  Skin:    General: Skin is warm and dry.     Capillary Refill: Capillary refill takes less than 2 seconds.     Findings: No ecchymosis, erythema, lesion or wound.  Neurological:     Mental Status: He is alert and oriented to person, place, and time.     GCS: GCS eye subscore is 4. GCS verbal subscore is 5. GCS motor subscore is 6.     Cranial Nerves: Cranial nerves 2-12 are intact.     Sensory: Sensation is intact.     Motor: Motor function is intact. No weakness or abnormal muscle tone.     Coordination: Coordination is intact.  Psychiatric:        Mood and Affect: Mood normal.        Speech: Speech normal.        Behavior: Behavior normal.    ED Results / Procedures / Treatments   Labs (all labs ordered are listed, but only abnormal results are displayed) Labs Reviewed  COMPREHENSIVE METABOLIC PANEL - Abnormal; Notable for the following components:      Result Value   CO2 21 (*)    All  other components within normal limits  LIPASE, BLOOD - Abnormal; Notable for the following components:   Lipase 63 (*)    All other components within normal limits  URINALYSIS, ROUTINE W REFLEX MICROSCOPIC - Abnormal; Notable for the following components:   Leukocytes,Ua SMALL (*)    Bacteria, UA MANY (*)    All other components within normal limits  CBC WITH DIFFERENTIAL/PLATELET  D-DIMER, QUANTITATIVE  TROPONIN I (HIGH SENSITIVITY)  TROPONIN I (HIGH SENSITIVITY)    EKG EKG Interpretation  Date/Time:  Tuesday September 08 2021 20:42:47 EST Ventricular Rate:  69 PR Interval:  154 QRS Duration: 110 QT Interval:  396 QTC Calculation: 424 R Axis:   26 Text Interpretation: Normal sinus rhythm Incomplete right bundle branch block Borderline ECG No previous ECGs available Confirmed by Orpah Greek [phone]) on 09/08/2021 11:04:57 PM  Radiology DG Chest Port 1 View  Result Date: 09/08/2021 CLINICAL DATA:  chest pain EXAM: PORTABLE CHEST 1 VIEW COMPARISON:  None. FINDINGS: The heart and mediastinal contours are within normal limits. Query vague retrocardiac airspace opacity. No pulmonary edema. No pleural effusion. No pneumothorax. No acute osseous abnormality. IMPRESSION: Query vague retrocardiac airspace opacity. Followup PA and lateral chest X-ray is recommended in 3-4 weeks following therapy to ensure resolution and exclude underlying malignancy. Electronically Signed   By: Iven Finn M.D.   On: 09/08/2021 21:34    Procedures Procedures    Medications Ordered in ED Medications  HYDROmorphone (DILAUDID) injection 1 mg (1 mg Intravenous Given 09/08/21 2326)  ondansetron (ZOFRAN) injection 4 mg (4 mg Intravenous Given 09/08/21 2326)  ketorolac (TORADOL) 30 MG/ML injection 15 mg (15 mg Intravenous Given 09/08/21 2326)    ED Course/ Medical Decision Making/ A&P                           Medical Decision Making Amount and/or Complexity of Data Reviewed Labs:  ordered.  Risk Prescription drug management.   46 year old male presents with chest and back pain.  He has no other past medical history.  Differential diagnosis musculoskeletal chest pain, musculoskeletal back pain, angina, pericarditis, NSTEMI, aortic syndrome  Patient is sinus rhythm on the monitor.  EKG without ischemic changes, no evidence of infarct.  No obvious signs of pericarditis.  Patient has a normal initial  troponin.  2-hour follow-up troponin is negative as well.  Symptoms are reproducible with movement and palpation.  This is most consistent with musculoskeletal pain.  Additionally, patient has had this pain reoccurrence over the last year with some frequency.  For all these reasons, aortic dissection is felt to be very unlikely.  Additionally D-dimer is negative.  He did have some pleuritic component but this is likely secondary to the musculoskeletal chest wall pain.  He is very low risk by Wells criteria and a normal D-dimer essentially rules out PE.  Patient treated with analgesia and improvement.        Final Clinical Impression(s) / ED Diagnoses Final diagnoses:  Chest wall pain    Rx / DC Orders ED Discharge Orders          Ordered    ibuprofen (ADVIL) 800 MG tablet  Every 6 hours PRN        09/09/21 0014    oxyCODONE-acetaminophen (PERCOCET) 5-325 MG tablet  Every 4 hours PRN        09/09/21 0014              Orpah Greek, MD 09/09/21 [phone]

## 2021-09-09 ENCOUNTER — Telehealth: Payer: Self-pay | Admitting: *Deleted

## 2021-09-09 MED ORDER — IBUPROFEN 800 MG PO TABS: 800.0000 mg | ORAL_TABLET | Freq: Four times a day (QID) | ORAL | 0 refills | Status: DC | PRN

## 2021-09-09 MED ORDER — OXYCODONE-ACETAMINOPHEN 5-325 MG PO TABS
1.0000 | ORAL_TABLET | ORAL | 0 refills | Status: AC | PRN
Start: 2021-09-09 — End: ?

## 2021-09-09 NOTE — Telephone Encounter (Signed)
Pt called regarding [facility] not releasing his narcotic without approval from EDP.

## 2021-10-04 ENCOUNTER — Encounter (HOSPITAL_COMMUNITY): Payer: Self-pay | Admitting: Radiology

## 2021-10-17 ENCOUNTER — Encounter (HOSPITAL_COMMUNITY): Payer: Self-pay | Admitting: Emergency Medicine

## 2021-10-17 ENCOUNTER — Emergency Department (HOSPITAL_COMMUNITY)
Admission: EM | Admit: 2021-10-17 | Discharge: 2021-10-17 | Disposition: A | Discharge status: Home / Self Care | Payer: Self-pay | Attending: Emergency Medicine | Admitting: Emergency Medicine

## 2021-10-17 ENCOUNTER — Other Ambulatory Visit: Payer: Self-pay

## 2021-10-17 DIAGNOSIS — Z202 Contact with and (suspected) exposure to infections with a predominantly sexual mode of transmission: Secondary | ICD-10-CM | POA: Insufficient documentation

## 2021-10-17 LAB — URINALYSIS, ROUTINE W REFLEX MICROSCOPIC
Bilirubin Urine: NEGATIVE
Glucose, UA: NEGATIVE mg/dL
Hgb urine dipstick: NEGATIVE
Ketones, ur: NEGATIVE mg/dL
Nitrite: NEGATIVE
Protein, ur: NEGATIVE mg/dL
Specific Gravity, Urine: 1.018 (ref 1.005–1.030)
pH: 5 (ref 5.0–8.0)

## 2021-10-17 MED ORDER — DOXYCYCLINE HYCLATE 100 MG PO CAPS: 100.0000 mg | ORAL_CAPSULE | Freq: Two times a day (BID) | ORAL | 0 refills | Status: DC

## 2021-10-17 NOTE — ED Provider Notes (Signed)
?[facility] EMERGENCY DEPARTMENT ?Provider Note ? ? ?CSN: [id number] ?Arrival date & time: 10/17/21  1140 ? ?  ? ?History ?No chief complaint on file. ? ? ?Alta Shober is a 46 y.o. male presenting due to an exposure to chlamydia.  Reports that his 1 male partner tested positive for this.  She tested negative for all other STDs.  He denies any dysuria, penile discharge, hematuria, testicular pain or other symptoms of STD. ? ? ?Home Medications ?Prior to Admission medications   ?Medication Sig Start Date End Date Taking? Authorizing Provider  ?doxycycline (VIBRAMYCIN) 100 MG capsule Take 1 capsule (100 mg total) by mouth 2 (two) times daily. 10/17/21  Yes Calub Tarnow A, PA-C  ?ibuprofen (ADVIL) 800 MG tablet Take 1 tablet (800 mg total) by mouth every 6 (six) hours as needed for moderate pain. 09/09/21   Gilda Crease, MD  ?omeprazole (PRILOSEC OTC) 20 MG tablet Take 20 mg by mouth daily as needed (heartburn).    [provider]  ?oxyCODONE-acetaminophen (PERCOCET) 5-325 MG tablet Take 1-2 tablets by mouth every 4 (four) hours as needed. 09/09/21   Gilda Crease, MD  ?   ? ?Allergies    ?Chocolate, Penicillins, and Bee venom   ? ?Review of Systems   ?Review of Systems ?See HPI ?Physical Exam ?Updated Vital Signs ?BP 129/66 (BP Location: Right Arm)   Pulse 72   Temp 98 ?F (36.7 ?C) (Oral)   Resp 18   SpO2 100%  ?Physical Exam ?Vitals and nursing note reviewed.  ?Constitutional:   ?   Appearance: Normal appearance.  ?HENT:  ?   Head: Normocephalic and atraumatic.  ?Eyes:  ?   General: No scleral icterus. ?   Conjunctiva/sclera: Conjunctivae normal.  ?Pulmonary:  ?   Effort: Pulmonary effort is normal. No respiratory distress.  ?Genitourinary: ?   Comments: GU exam deferred ?Skin: ?   Findings: No rash.  ?Neurological:  ?   Mental Status: He is alert.  ?Psychiatric:     ?   Mood and Affect: Mood normal.  ? ? ?ED Results / Procedures / Treatments   ?Labs ?(all labs  ordered are listed, but only abnormal results are displayed) ?Labs Reviewed  ?URINALYSIS, ROUTINE W REFLEX MICROSCOPIC  ?GC/CHLAMYDIA PROBE AMP ([place]) NOT AT [facility]  ? ? ?EKG ?None ? ?Radiology ?No results found. ? ?Procedures ?Procedures  ? ?Medications Ordered in ED ?Medications - No data to display ? ?ED Course/ Medical Decision Making/ A&P ?  ?                        ?Medical Decision Making ?Amount and/or Complexity of Data Reviewed ?Labs: ordered. ? ?Risk ?Prescription drug management. ? ? ?46 year old male with a known chlamydia exposure.  Will be treated with doxycycline.  No need for empiric treatment of other STDs, denies any other sexual partners and his male partner tested negative. ? ?He will be referred to the health department for any other STD concerns.  GC/chlamydia results will be in his online portal. ? ?Final Clinical Impression(s) / ED Diagnoses ?Final diagnoses:  ?Exposure to chlamydia  ? ? ?Rx / DC Orders ?ED Discharge Orders   ? ?      Ordered  ?  doxycycline (VIBRAMYCIN) 100 MG capsule  2 times daily       ? 10/17/21 1153  ? ?  ?  ? ?  ? ?Results and diagnoses were explained to the  patient. Return precautions discussed in full. Patient had no additional questions and expressed complete understanding. ? ? ?This chart was dictated using voice recognition software.  Despite best efforts to proofread,  errors can occur which can change the documentation meaning.  ?  ?Saddie Benders, PA-C ?10/17/21 1157 ? ?  ?Gloris Manchester, MD ?10/17/21 2354 ? ?

## 2021-10-17 NOTE — ED Triage Notes (Signed)
Requesting STD testing.  States he received a call this morning from the woman he was dating for the last year that states she has been cheating on him and tested + for chlamydia.  Pt denies any symptoms.   ?

## 2021-10-17 NOTE — Discharge Instructions (Addendum)
Doxycycline treats chlamydia and is at your pharmacy.  For any other concerns for STDs, you may go to the health department.  You will be able to see your results online however no further treatment will be necessary after the antibiotics that you take over the next 10 days. ?

## 2022-04-19 ENCOUNTER — Encounter (HOSPITAL_BASED_OUTPATIENT_CLINIC_OR_DEPARTMENT_OTHER): Payer: Self-pay

## 2022-04-19 ENCOUNTER — Emergency Department (HOSPITAL_BASED_OUTPATIENT_CLINIC_OR_DEPARTMENT_OTHER): Payer: Self-pay

## 2022-04-19 ENCOUNTER — Other Ambulatory Visit: Payer: Self-pay

## 2022-04-19 ENCOUNTER — Encounter (HOSPITAL_COMMUNITY): Payer: Self-pay

## 2022-04-19 ENCOUNTER — Inpatient Hospital Stay (HOSPITAL_BASED_OUTPATIENT_CLINIC_OR_DEPARTMENT_OTHER)
Admission: EM | Admit: 2022-04-19 | Discharge: 2022-04-23 | DRG: 871 | Disposition: A | Discharge status: Home / Self Care | Payer: Self-pay | Attending: Internal Medicine | Admitting: Internal Medicine

## 2022-04-19 DIAGNOSIS — E042 Nontoxic multinodular goiter: Secondary | ICD-10-CM | POA: Diagnosis present

## 2022-04-19 DIAGNOSIS — K4020 Bilateral inguinal hernia, without obstruction or gangrene, not specified as recurrent: Secondary | ICD-10-CM | POA: Diagnosis present

## 2022-04-19 DIAGNOSIS — Z20822 Contact with and (suspected) exposure to covid-19: Secondary | ICD-10-CM | POA: Diagnosis present

## 2022-04-19 DIAGNOSIS — R6520 Severe sepsis without septic shock: Secondary | ICD-10-CM | POA: Diagnosis present

## 2022-04-19 DIAGNOSIS — E872 Acidosis, unspecified: Secondary | ICD-10-CM | POA: Diagnosis present

## 2022-04-19 DIAGNOSIS — F17210 Nicotine dependence, cigarettes, uncomplicated: Secondary | ICD-10-CM | POA: Diagnosis present

## 2022-04-19 DIAGNOSIS — Z880 Allergy status to penicillin: Secondary | ICD-10-CM

## 2022-04-19 DIAGNOSIS — Z720 Tobacco use: Secondary | ICD-10-CM | POA: Diagnosis present

## 2022-04-19 DIAGNOSIS — N281 Cyst of kidney, acquired: Secondary | ICD-10-CM | POA: Diagnosis present

## 2022-04-19 DIAGNOSIS — R6510 Systemic inflammatory response syndrome (SIRS) of non-infectious origin without acute organ dysfunction: Secondary | ICD-10-CM

## 2022-04-19 DIAGNOSIS — I4892 Unspecified atrial flutter: Secondary | ICD-10-CM | POA: Diagnosis present

## 2022-04-19 DIAGNOSIS — R590 Localized enlarged lymph nodes: Secondary | ICD-10-CM | POA: Diagnosis present

## 2022-04-19 DIAGNOSIS — A419 Sepsis, unspecified organism: Secondary | ICD-10-CM | POA: Diagnosis present

## 2022-04-19 DIAGNOSIS — I4891 Unspecified atrial fibrillation: Secondary | ICD-10-CM

## 2022-04-19 DIAGNOSIS — I480 Paroxysmal atrial fibrillation: Secondary | ICD-10-CM | POA: Diagnosis present

## 2022-04-19 DIAGNOSIS — A403 Sepsis due to Streptococcus pneumoniae: Principal | ICD-10-CM | POA: Diagnosis present

## 2022-04-19 DIAGNOSIS — J13 Pneumonia due to Streptococcus pneumoniae: Secondary | ICD-10-CM | POA: Diagnosis present

## 2022-04-19 DIAGNOSIS — E876 Hypokalemia: Secondary | ICD-10-CM | POA: Diagnosis present

## 2022-04-19 DIAGNOSIS — I4510 Unspecified right bundle-branch block: Secondary | ICD-10-CM | POA: Diagnosis present

## 2022-04-19 DIAGNOSIS — J189 Pneumonia, unspecified organism: Principal | ICD-10-CM

## 2022-04-19 DIAGNOSIS — K219 Gastro-esophageal reflux disease without esophagitis: Secondary | ICD-10-CM | POA: Diagnosis present

## 2022-04-19 DIAGNOSIS — R911 Solitary pulmonary nodule: Secondary | ICD-10-CM | POA: Diagnosis present

## 2022-04-19 LAB — CBC
HCT: 39.5 % (ref 39.0–52.0)
Hemoglobin: 13.6 g/dL (ref 13.0–17.0)
MCH: 30.5 pg (ref 26.0–34.0)
MCHC: 34.4 g/dL (ref 30.0–36.0)
MCV: 88.6 fL (ref 80.0–100.0)
Platelets: 200 10*3/uL (ref 150–400)
RBC: 4.46 MIL/uL (ref 4.22–5.81)
RDW: 13.6 % (ref 11.5–15.5)
WBC: 23.1 10*3/uL — ABNORMAL HIGH (ref 4.0–10.5)
nRBC: 0 % (ref 0.0–0.2)

## 2022-04-19 LAB — RESP PANEL BY RT-PCR (FLU A&B, COVID) ARPGX2
Influenza A by PCR: NEGATIVE
Influenza B by PCR: NEGATIVE
SARS Coronavirus 2 by RT PCR: NEGATIVE

## 2022-04-19 LAB — URINALYSIS, ROUTINE W REFLEX MICROSCOPIC
Bilirubin Urine: NEGATIVE
Glucose, UA: NEGATIVE mg/dL
Ketones, ur: NEGATIVE mg/dL
Leukocytes,Ua: NEGATIVE
Nitrite: NEGATIVE
Protein, ur: >=300 mg/dL — AB
Specific Gravity, Urine: 1.02 (ref 1.005–1.030)
pH: 7 (ref 5.0–8.0)

## 2022-04-19 LAB — COMPREHENSIVE METABOLIC PANEL
ALT: 25 U/L (ref 0–44)
AST: 30 U/L (ref 15–41)
Albumin: 3 g/dL — ABNORMAL LOW (ref 3.5–5.0)
Alkaline Phosphatase: 84 U/L (ref 38–126)
Anion gap: 9 (ref 5–15)
BUN: 15 mg/dL (ref 6–20)
CO2: 20 mmol/L — ABNORMAL LOW (ref 22–32)
Calcium: 8.8 mg/dL — ABNORMAL LOW (ref 8.9–10.3)
Chloride: 106 mmol/L (ref 98–111)
Creatinine, Ser: 0.94 mg/dL (ref 0.61–1.24)
GFR, Estimated: >60 mL/min (ref >60)
Glucose, Bld: 138 mg/dL — ABNORMAL HIGH (ref 70–99)
Potassium: 3.5 mmol/L (ref 3.5–5.1)
Sodium: 135 mmol/L (ref 135–145)
Total Bilirubin: 0.6 mg/dL (ref 0.3–1.2)
Total Protein: 6.5 g/dL (ref 6.5–8.1)

## 2022-04-19 LAB — URINALYSIS, MICROSCOPIC (REFLEX)

## 2022-04-19 LAB — LACTIC ACID, PLASMA
Lactic Acid, Venous: 2.3 mmol/L (ref 0.5–1.9)
Lactic Acid, Venous: 1.5 mmol/L (ref 0.5–1.9)

## 2022-04-19 LAB — LIPASE, BLOOD: Lipase: 32 U/L (ref 11–51)

## 2022-04-19 LAB — TROPONIN I (HIGH SENSITIVITY)
Troponin I (High Sensitivity): 11 ng/L (ref <18)
Troponin I (High Sensitivity): 9 ng/L (ref <18)

## 2022-04-19 LAB — BRAIN NATRIURETIC PEPTIDE: B Natriuretic Peptide: 165.3 pg/mL — ABNORMAL HIGH (ref 0.0–100.0)

## 2022-04-19 MED ORDER — MORPHINE SULFATE (PF) 4 MG/ML IV SOLN
4.0000 mg | Freq: Once | INTRAVENOUS | Status: AC
  Administered 2022-04-19: 4 mg via INTRAVENOUS
  Filled 2022-04-19: qty 1

## 2022-04-19 MED ORDER — FENTANYL CITRATE PF 50 MCG/ML IJ SOSY
50.0000 ug | PREFILLED_SYRINGE | Freq: Once | INTRAMUSCULAR | Status: AC
  Administered 2022-04-19: 50 ug via INTRAVENOUS
  Filled 2022-04-19: qty 1

## 2022-04-19 MED ORDER — ACETAMINOPHEN 325 MG PO TABS
650.0000 mg | ORAL_TABLET | Freq: Once | ORAL | Status: AC
  Administered 2022-04-19: 650 mg via ORAL
  Filled 2022-04-19: qty 2

## 2022-04-19 MED ORDER — LEVOFLOXACIN IN D5W 750 MG/150ML IV SOLN
750.0000 mg | INTRAVENOUS | Status: DC
  Administered 2022-04-20: 750 mg via INTRAVENOUS
  Filled 2022-04-19 (×2): qty 150

## 2022-04-19 MED ORDER — HYDROMORPHONE HCL 1 MG/ML IJ SOLN
1.0000 mg | Freq: Once | INTRAMUSCULAR | Status: AC
  Administered 2022-04-19: 1 mg via INTRAVENOUS
  Filled 2022-04-19: qty 1

## 2022-04-19 MED ORDER — ACETAMINOPHEN 500 MG PO TABS
1000.0000 mg | ORAL_TABLET | Freq: Once | ORAL | Status: AC
  Administered 2022-04-19: 1000 mg via ORAL
  Filled 2022-04-19: qty 2

## 2022-04-19 MED ORDER — ASPIRIN 81 MG PO CHEW
324.0000 mg | CHEWABLE_TABLET | Freq: Once | ORAL | Status: AC
  Administered 2022-04-19: 324 mg via ORAL
  Filled 2022-04-19: qty 4

## 2022-04-19 MED ORDER — SODIUM CHLORIDE 0.9 % IV BOLUS
500.0000 mL | Freq: Once | INTRAVENOUS | Status: AC
  Administered 2022-04-19: 500 mL via INTRAVENOUS

## 2022-04-19 MED ORDER — HYDROMORPHONE HCL 1 MG/ML IJ SOLN: 1.0000 mg | Freq: Once | INTRAMUSCULAR | Status: DC

## 2022-04-19 MED ORDER — IOHEXOL 350 MG/ML SOLN
100.0000 mL | Freq: Once | INTRAVENOUS | Status: AC | PRN
  Administered 2022-04-19: 100 mL via INTRAVENOUS

## 2022-04-19 MED ORDER — HYDROMORPHONE HCL 1 MG/ML IJ SOLN
1.0000 mg | INTRAMUSCULAR | Status: DC | PRN
  Administered 2022-04-20 (×4): 1 mg via INTRAVENOUS
  Filled 2022-04-19 (×4): qty 1

## 2022-04-19 MED ORDER — METRONIDAZOLE 500 MG/100ML IV SOLN
500.0000 mg | Freq: Two times a day (BID) | INTRAVENOUS | Status: DC
  Administered 2022-04-20 – 2022-04-22 (×5): 500 mg via INTRAVENOUS
  Filled 2022-04-19 (×6): qty 100

## 2022-04-19 MED ORDER — SODIUM CHLORIDE 0.9 % IV SOLN: Freq: Once | INTRAVENOUS | Status: AC

## 2022-04-19 MED ORDER — ONDANSETRON HCL 4 MG/2ML IJ SOLN
4.0000 mg | Freq: Once | INTRAMUSCULAR | Status: AC
  Administered 2022-04-19: 4 mg via INTRAVENOUS
  Filled 2022-04-19: qty 2

## 2022-04-19 MED ORDER — LEVOFLOXACIN IN D5W 750 MG/150ML IV SOLN
750.0000 mg | Freq: Once | INTRAVENOUS | Status: AC
  Administered 2022-04-19: 750 mg via INTRAVENOUS
  Filled 2022-04-19: qty 150

## 2022-04-19 NOTE — ED Notes (Addendum)
Error in charting.

## 2022-04-19 NOTE — ED Triage Notes (Signed)
C/O chest pain and back pain started Saturday along w/ cough and headache. C/O shortness of breath and increased respiration; c/o nauseous, denies vomiting and diarrhea. Denies any medical problem.

## 2022-04-19 NOTE — ED Notes (Signed)
Spoke with Maggie in lab to add on BNP

## 2022-04-19 NOTE — ED Notes (Signed)
ED TO INPATIENT HANDOFF REPORT  ED Nurse Name and Phone #: [facility] Paramedic  S Name/Age/Gender Scott Carrillo 46 y.o. male Room/Bed: MH06/MH06  Code Status   Code Status: Not on file  Home/SNF/Other Home Patient oriented to: self, place, time, and situation Is this baseline? Yes   Triage Complete: Triage complete  Chief Complaint Sepsis due to pneumonia ([place]) [J18.9, A41.9]  Triage Note C/O chest pain and back pain started Saturday along w/ cough and headache. C/O shortness of breath and increased respiration; c/o nauseous, denies vomiting and diarrhea. Denies any medical problem.    Allergies Allergies  Allergen Reactions   Chocolate Anaphylaxis   Penicillins     Anaphylaxis    Bee Venom Hives    Level of Care/Admitting Diagnosis ED Disposition     ED Disposition  Admit   Condition  --   Comment  Hospital Area: [facility] [[id number]]  Level of Care: Progressive [102]  Admit to Progressive based on following criteria: MULTISYSTEM THREATS such as stable sepsis, metabolic/electrolyte imbalance with or without encephalopathy that is responding to early treatment.  May [facility] patient to [facility] if equivalent level of care is available:: Yes  Interfacility transfer: Yes  Covid Evaluation: Confirmed COVID Negative  Diagnosis: Sepsis due to pneumonia [facility]) [[id number]]  Admitting Physician: Orma Flaming [[id number]]  Attending Physician: Orma Flaming [[id number]]  Certification:: I certify this patient will need inpatient services for at least 2 midnights  Estimated Length of Stay: 3          B Medical/Surgery History History reviewed. No pertinent past medical history. Past Surgical History:  Procedure Laterality Date   WISDOM TOOTH EXTRACTION       A IV Location/Drains/Wounds Patient Lines/Drains/Airways Status     Active Line/Drains/Airways     Name Placement date Placement time Site Days   Peripheral IV  04/19/22 20 G Left Antecubital 04/19/22  1042  Antecubital  less than 1   Peripheral IV 04/19/22 18 G Right Antecubital 04/19/22  1103  Antecubital  less than 1            Intake/Output Last 24 hours  Intake/Output Summary (Last 24 hours) at 04/19/2022 2113 Last data filed at 04/19/2022 1150 Gross per 24 hour  Intake 500.49 ml  Output --  Net 500.49 ml    Labs/Imaging Results for orders placed or performed during the hospital encounter of 04/19/22 (from the past 48 hour(s))  CBC     Status: Abnormal   Collection Time: 04/19/22 10:35 AM  Result Value Ref Range   WBC 23.1 (H) 4.0 - 10.5 K/uL   RBC 4.46 4.22 - 5.81 MIL/uL   Hemoglobin 13.6 13.0 - 17.0 g/dL   HCT 39.5 39.0 - 52.0 %   MCV 88.6 80.0 - 100.0 fL   MCH 30.5 26.0 - 34.0 pg   MCHC 34.4 30.0 - 36.0 g/dL   RDW 13.6 11.5 - 15.5 %   Platelets 200 150 - 400 K/uL   nRBC 0.0 0.0 - 0.2 %    Comment: Performed at [facility], [address] [place], [place] [address]  Troponin I (High Sensitivity)     Status: None   Collection Time: 04/19/22 10:35 AM  Result Value Ref Range   Troponin I (High Sensitivity) 11 <18 ng/L    Comment: (NOTE) Elevated high sensitivity troponin I (hsTnI) values and significant  changes across serial measurements may suggest ACS but many other  chronic and acute conditions are known to elevate hsTnI results.  Refer to the "Links" section for chest pain algorithms and additional  guidance. Performed at [facility], [address] [place], [place] [address]   Comprehensive metabolic panel     Status: Abnormal   Collection Time: 04/19/22 10:35 AM  Result Value Ref Range   Sodium 135 135 - 145 mmol/L   Potassium 3.5 3.5 - 5.1 mmol/L   Chloride 106 98 - 111 mmol/L   CO2 20 (L) 22 - 32 mmol/L   Glucose, Bld 138 (H) 70 - 99 mg/dL    Comment: Glucose reference range applies only to samples taken after fasting for at least 8 hours.   BUN 15 6 - 20 mg/dL   Creatinine, Ser  0.94 0.61 - 1.24 mg/dL   Calcium 8.8 (L) 8.9 - 10.3 mg/dL   Total Protein 6.5 6.5 - 8.1 g/dL   Albumin 3.0 (L) 3.5 - 5.0 g/dL   AST 30 15 - 41 U/L   ALT 25 0 - 44 U/L   Alkaline Phosphatase 84 38 - 126 U/L   Total Bilirubin 0.6 0.3 - 1.2 mg/dL   GFR, Estimated >60 >60 mL/min    Comment: (NOTE) Calculated using the CKD-EPI Creatinine Equation (2021)    Anion gap 9 5 - 15    Comment: Performed at [facility], [address] [place], [place] [address]  Lipase, blood     Status: None   Collection Time: 04/19/22 10:35 AM  Result Value Ref Range   Lipase 32 11 - 51 U/L    Comment: Performed at [facility], [address] [place], [place] [address]  Brain natriuretic peptide     Status: Abnormal   Collection Time: 04/19/22 10:35 AM  Result Value Ref Range   B Natriuretic Peptide 165.3 (H) 0.0 - 100.0 pg/mL    Comment: Performed at [facility], [address] [place], [place] [address]  Resp Panel by RT-PCR (Flu A&B, Covid) Anterior Nasal Swab     Status: None   Collection Time: 04/19/22 10:36 AM   Specimen: Anterior Nasal Swab  Result Value Ref Range   SARS Coronavirus 2 by RT PCR NEGATIVE NEGATIVE    Comment: (NOTE) SARS-CoV-2 target nucleic acids are NOT DETECTED.  The SARS-CoV-2 RNA is generally detectable in upper respiratory specimens during the acute phase of infection. The lowest concentration of SARS-CoV-2 viral copies this assay can detect is 138 copies/mL. A negative result does not preclude SARS-Cov-2 infection and should not be used as the sole basis for treatment or other patient management decisions. A negative result may occur with  improper specimen collection/handling, submission of specimen other than nasopharyngeal swab, presence of viral mutation(s) within the areas targeted by this assay, and inadequate number of viral copies(<138 copies/mL). A negative result must be combined with clinical observations, patient history,  and epidemiological information. The expected result is Negative.  Fact Sheet for Patients:  EntrepreneurPulse.com.au  Fact Sheet for Healthcare Providers:  IncredibleEmployment.be  This test is no t yet approved or cleared by the [place] FDA and  has been authorized for detection and/or diagnosis of SARS-CoV-2 by FDA under an Emergency Use Authorization (EUA). This EUA will remain  in effect (meaning this test can be used) for the duration of the COVID-19 declaration under Section 564(b)(1) of the Act, 21 U.S.C.section 360bbb-3(b)(1), unless the authorization is terminated  or revoked sooner.  Influenza A by PCR NEGATIVE NEGATIVE   Influenza B by PCR NEGATIVE NEGATIVE    Comment: (NOTE) The Xpert Xpress SARS-CoV-2/FLU/RSV plus assay is intended as an aid in the diagnosis of influenza from Nasopharyngeal swab specimens and should not be used as a sole basis for treatment. Nasal washings and aspirates are unacceptable for Xpert Xpress SARS-CoV-2/FLU/RSV testing.  Fact Sheet for Patients: EntrepreneurPulse.com.au  Fact Sheet for Healthcare Providers: IncredibleEmployment.be  This test is not yet approved or cleared by the [place] FDA and has been authorized for detection and/or diagnosis of SARS-CoV-2 by FDA under an Emergency Use Authorization (EUA). This EUA will remain in effect (meaning this test can be used) for the duration of the COVID-19 declaration under Section 564(b)(1) of the Act, 21 U.S.C. section 360bbb-3(b)(1), unless the authorization is terminated or revoked.  Performed at [facility], [address] [place], [place] [address]   Lactic acid, plasma     Status: Abnormal   Collection Time: 04/19/22 10:36 AM  Result Value Ref Range   Lactic Acid, Venous 2.3 (HH) 0.5 - 1.9 mmol/L    Comment: CRITICAL RESULT CALLED TO, READ BACK BY AND VERIFIED WITH JERRI  PEGRAM @1127  04/19/2022 OLSONM Performed at [facility], [address] [place], [place] [address]   Lactic acid, plasma     Status: None   Collection Time: 04/19/22 12:35 PM  Result Value Ref Range   Lactic Acid, Venous 1.5 0.5 - 1.9 mmol/L    Comment: Performed at [facility], [address] [place], [place] [address]  Troponin I (High Sensitivity)     Status: None   Collection Time: 04/19/22 12:35 PM  Result Value Ref Range   Troponin I (High Sensitivity) 9 <18 ng/L    Comment: (NOTE) Elevated high sensitivity troponin I (hsTnI) values and significant  changes across serial measurements may suggest ACS but many other  chronic and acute conditions are known to elevate hsTnI results.  Refer to the "Links" section for chest pain algorithms and additional  guidance. Performed at [facility], [address] [place], [place] [address]   Urinalysis, Routine w reflex microscopic Urine, Clean Catch     Status: Abnormal   Collection Time: 04/19/22  1:35 PM  Result Value Ref Range   Color, Urine YELLOW YELLOW   APPearance CLOUDY (A) CLEAR   Specific Gravity, Urine 1.020 1.005 - 1.030   pH 7.0 5.0 - 8.0   Glucose, UA NEGATIVE NEGATIVE mg/dL   Hgb urine dipstick SMALL (A) NEGATIVE   Bilirubin Urine NEGATIVE NEGATIVE   Ketones, ur NEGATIVE NEGATIVE mg/dL   Protein, ur >=300 (A) NEGATIVE mg/dL   Nitrite NEGATIVE NEGATIVE   Leukocytes,Ua NEGATIVE NEGATIVE    Comment: Performed at [facility], [address] [place], [place] [address]  Urinalysis, Microscopic (reflex)     Status: Abnormal   Collection Time: 04/19/22  1:35 PM  Result Value Ref Range   RBC / HPF 0-5 0 - 5 RBC/hpf   WBC, UA 6-10 0 - 5 WBC/hpf   Bacteria, UA MANY (A) NONE SEEN   Squamous Epithelial / LPF 0-5 0 - 5    Comment: Performed at [facility], [address] [place], [place] [address]   US Venous Img Upper Left (DVT Study)  Result Date:  04/19/2022 CLINICAL DATA:  Possible thrombus seen on recent CT imaging EXAM: LEFT UPPER EXTREMITY VENOUS  DOPPLER ULTRASOUND TECHNIQUE: Gray-scale sonography with graded compression, as well as color Doppler and duplex ultrasound were performed to evaluate the upper extremity deep venous system from the level of the subclavian vein and including the jugular, axillary, basilic, radial, ulnar and upper cephalic vein. Spectral Doppler was utilized to evaluate flow at rest and with distal augmentation maneuvers. COMPARISON:  None Available. FINDINGS: Contralateral Subclavian Vein: Respiratory phasicity is normal and symmetric with the symptomatic side. No evidence of thrombus. Normal compressibility. Internal Jugular Vein: No evidence of thrombus. Normal compressibility, respiratory phasicity and response to augmentation. Subclavian Vein: No evidence of thrombus. Normal compressibility, respiratory phasicity and response to augmentation. Axillary Vein: No evidence of thrombus. Normal compressibility, respiratory phasicity and response to augmentation. Cephalic Vein: No evidence of thrombus. Normal compressibility, respiratory phasicity and response to augmentation. Basilic Vein: No evidence of thrombus. Normal compressibility, respiratory phasicity and response to augmentation. Brachial Veins: No evidence of thrombus. Normal compressibility, respiratory phasicity and response to augmentation. Radial Veins: No evidence of thrombus. Normal compressibility, respiratory phasicity and response to augmentation. Ulnar Veins: No evidence of thrombus. Normal compressibility, respiratory phasicity and response to augmentation. Venous Reflux:  None visualized. Other Findings:  None visualized. IMPRESSION: No evidence of DVT within the left upper extremity. Electronically Signed   By: Jacqulynn Cadet M.D.   On: 04/19/2022 14:50   CT Angio Chest/Abd/Pel for Dissection W and/or W/WO  Result Date: 04/19/2022 CLINICAL DATA:  Acute  aortic syndrome suspected. Chest pain, back pain, cough. Nausea, headache, shortness of breath. The EXAM: CT ANGIOGRAPHY CHEST, ABDOMEN AND PELVIS TECHNIQUE: Non-contrast CT of the chest was initially obtained. Multidetector CT imaging through the chest, abdomen and pelvis was performed using the standard protocol during bolus administration of intravenous contrast. Multiplanar reconstructed images and MIPs were obtained and reviewed to evaluate the vascular anatomy. RADIATION DOSE REDUCTION: This exam was performed according to the departmental dose-optimization program which includes automated exposure control, adjustment of the mA and/or kV according to patient size and/or use of iterative reconstruction technique. CONTRAST:  154mL OMNIPAQUE IOHEXOL 350 MG/ML SOLN COMPARISON:  Chest x-ray on 04/19/2022 FINDINGS: CTA CHEST FINDINGS Cardiovascular: Heart is normal in size. No pericardial effusion. No evidence for aortic dissection or aneurysm. The pulmonary arteries are normal in appearance accounting for contrast bolus timing. Multiple collaterals are identified in the LEFT shoulder region. There is possible thrombus within the LOWER LEFT internal jugular vein (6:1). Possible thrombus versus mixing of opacified and non-opacified blood within the LEFT subclavian/superior vena cava. Recommend further evaluation with LEFT UPPER extremity venous doppler. Mediastinum/Nodes: Multiple thyroid nodules are identified. Within the isthmus, the largest is 1.7 centimeters (6:10). Additional thyroid nodules are identified in the LEFT lobe. Mediastinal adenopathy noted, largest RIGHT paratracheal lymph node measures 1.2 centimeters. There is confluent opacity in the RIGHT hilar region, precluding evaluation for discrete lymph nodes. However, adenopathy in this region is suspected. No significant LEFT hilar adenopathy. Lungs/Pleura: There is masslike consolidation of the RIGHT UPPER lobe and RIGHT middle lobe, with early  cavitation noted in these regions. The involved area is estimated to measure 9.1 x 10.1 x 5.4 centimeters. Consolidation and/or atelectasis present within the RIGHT LOWER lobe. Ground-glass pulmonary nodule in the RIGHT LOWER lobe is 5 millimeters on image 7:26. LEFT lung is clear. No pleural effusions. Musculoskeletal: No chest wall abnormality. No acute or significant osseous findings. Review of the MIP images confirms the above findings. CTA ABDOMEN AND PELVIS FINDINGS VASCULAR Aorta: Normal caliber aorta without aneurysm, dissection, vasculitis  or significant stenosis. Celiac: Patent without evidence of aneurysm, dissection, vasculitis or significant stenosis. SMA: Patent without evidence of aneurysm, dissection, vasculitis or significant stenosis. Renals: Both renal arteries are patent without evidence of aneurysm, dissection, vasculitis, fibromuscular dysplasia or significant stenosis. IMA: Patent without evidence of aneurysm, dissection, vasculitis or significant stenosis. Inflow: Patent without evidence of aneurysm, dissection, vasculitis or significant stenosis. Veins: No obvious venous abnormality within the limitations of this arterial phase study. Review of the MIP images confirms the above findings. NON-VASCULAR Hepatobiliary: No focal liver abnormality is seen. No gallstones, gallbladder wall thickening, or biliary dilatation. Pancreas: Unremarkable. No pancreatic ductal dilatation or surrounding inflammatory changes. Spleen: Normal in size without focal abnormality. Adrenals/Urinary Tract: Normal adrenal glands. Bilateral renal cysts, largest in the RIGHT kidney measuring 2.1 centimeters. No further imaging is recommended. No hydronephrosis. Ureters are unremarkable. The bladder and visualized portion of the urethra are normal. Stomach and small bowel loops are normal in appearance. The appendix is well seen and normal in appearance. Colon is unremarkable. Lymphatic: Unremarkable. Reproductive:  Prostatic calcifications noted. Other: No ascites.  Small fat containing bilateral inguinal hernias. Musculoskeletal: No acute or significant osseous findings. Review of the MIP images confirms the above findings. IMPRESSION: 1. Masslike consolidation of the RIGHT UPPER lobe and RIGHT middle lobe, with early cavitation present. The involved area is estimated to measure 9.1 x 10.1 x 5.4 centimeters. Although the findings are suspicious for malignancy, the rapid change since prior chest x-ray performed on 09/08/2021 suggest infectious/inflammatory causes. Necrotizing infection should be excluded. 2. Discrete 5 millimeter ground-glass pulmonary nodule in the RIGHT LOWER lobe. 3. Mediastinal and RIGHT hilar adenopathy. 4. No evidence for aortic dissection or aneurysm. 5. Possible thrombus within the LEFT internal jugular, subclavian vein, and superior vena cava. Recommend further evaluation with LEFT UPPER extremity venous Doppler exam. 6. Bilateral renal cysts. No follow-up imaging is recommended. JACR 2018 Feb; 264-273, Management of the Incidental RenalMass on CT, RadioGraphics 2021; 814-848, Bosniak Classification of Cystic Renal Masses, Version 2019. 7. Normal appendix. 8. Small fat containing bilateral inguinal hernias. 9. Multiple thyroid nodules, the largest measuring 1.7 centimeters. Recommend non-emergent thyroid ultrasound. Reference: J Am Coll Radiol. 2015 Feb;12(2): 143-50 Electronically Signed   By: Nolon Nations M.D.   On: 04/19/2022 13:42   DG Chest Portable 1 View  Result Date: 04/19/2022 CLINICAL DATA:  Shortness of breath with cough and headache. EXAM: PORTABLE CHEST 1 VIEW COMPARISON:  Chest radiograph 09/07/2018 FINDINGS: The cardiomediastinal silhouette is normal. There is confluent opacity projecting over the right lower lobe. The right upper lobe is well-aerated. The left lung is clear. There is a small right pleural effusion. There is no significant left effusion. There is no pneumothorax  There is no acute osseous abnormality. IMPRESSION: Confluent right lower lobe opacity raising concern for pneumonia with small parapneumonic effusion. Recommend follow-up radiographs in 3-4 weeks to ensure resolution. Electronically Signed   By: Valetta Mole M.D.   On: 04/19/2022 11:13    Pending Labs Unresulted Labs (From admission, onward)     Start     Ordered   04/19/22 1047  Culture, blood (routine x 2)  BLOOD CULTURE X 2,   STAT      04/19/22 1047            Vitals/Pain Today's Vitals   04/19/22 1834 04/19/22 1842 04/19/22 1914 04/19/22 2030  BP:    116/67  Pulse: (!) 106  (!) 106 (!) 109  Resp: (!) 9  (!) 37 (!)  31  Temp:      TempSrc:      SpO2: 95%  98% 95%  Weight:      Height:      PainSc:  6       Isolation Precautions No active isolations  Medications Medications  HYDROmorphone (DILAUDID) injection 1 mg (has no administration in time range)  morphine (PF) 4 MG/ML injection 4 mg (4 mg Intravenous Given 04/19/22 1049)  aspirin chewable tablet 324 mg (324 mg Oral Given 04/19/22 1045)  ondansetron (ZOFRAN) injection 4 mg (4 mg Intravenous Given 04/19/22 1048)  sodium chloride 0.9 % bolus 500 mL ( Intravenous Stopped 04/19/22 1149)  levofloxacin (LEVAQUIN) IVPB 750 mg (0 mg Intravenous Stopped 04/19/22 1440)  iohexol (OMNIPAQUE) 350 MG/ML injection 100 mL (100 mLs Intravenous Contrast Given 04/19/22 1257)  HYDROmorphone (DILAUDID) injection 1 mg (1 mg Intravenous Given 04/19/22 1331)  acetaminophen (TYLENOL) tablet 650 mg (650 mg Oral Given 04/19/22 1330)  0.9 %  sodium chloride infusion ( Intravenous New Bag/Given 04/19/22 1441)  fentaNYL (SUBLIMAZE) injection 50 mcg (50 mcg Intravenous Given 04/19/22 1802)    Mobility walks Low fall risk   Focused Assessments Pulmonary Assessment Handoff:  Lung sounds:   O2 Device: Nasal Cannula O2 Flow Rate (L/min): 2 L/min    R Recommendations: See Admitting Provider Note  Report given to:   Additional Notes:   Patient is a 70 YOM who presented to [place] with SOB, cough and a low grade fever. Patient has a long hx of smoking. Being admitted for sepsis due to pneumonia. Family is at the patients side (Mother Raelene Bott). Patient has 2 patient IV's, one 20g and one 18g in both antecubitals. He has a GCS of 15 and is CAOX4. He is on oxygen at 2lpm and has NS running at 191mL/hr. He has a productive cough and CT revealed upper lobe pneumonia and new masses and nodules.

## 2022-04-19 NOTE — ED Provider Notes (Signed)
[facility] EMERGENCY DEPARTMENT Provider Note   CSN: [id number] Arrival date & time: 04/19/22  1020     History  Chief Complaint  Patient presents with   Chest Pain   Back Pain   Shortness of Breath    Scott Carrillo is a 46 y.o. male.  He has no significant past medical history.  He is complaining of right-sided chest pain and back right upper quadrant pain nausea vomiting shortness of breath that started 3 days ago.  Not associated with any known trauma.  Has been trying Naprosyn oxycodone and muscle relaxant without any improvement.  Has had a cough and headache.  No fever.  Does smoke denies any drugs.  Rates the pain as 10 out of 10.  The history is provided by the patient.  Chest Pain Pain location:  R chest Pain quality: aching   Pain radiates to:  Mid back Pain severity:  Severe Onset quality:  Gradual Duration:  3 days Timing:  Constant Progression:  Unchanged Chronicity:  New Relieved by:  Nothing Worsened by:  Nothing Ineffective treatments: Ibuprofen oxycodone muscle relaxant. Associated symptoms: back pain, headache, nausea and shortness of breath   Associated symptoms: no fever   Risk factors: male sex and smoking        Home Medications Prior to Admission medications   Medication Sig Start Date End Date Taking? Authorizing Provider  doxycycline (VIBRAMYCIN) 100 MG capsule Take 1 capsule (100 mg total) by mouth 2 (two) times daily. 10/17/21   Redwine, Madison A, PA-C  ibuprofen (ADVIL) 800 MG tablet Take 1 tablet (800 mg total) by mouth every 6 (six) hours as needed for moderate pain. 09/09/21   Orpah Greek, MD  omeprazole (PRILOSEC OTC) 20 MG tablet Take 20 mg by mouth daily as needed (heartburn).    [provider]  oxyCODONE-acetaminophen (PERCOCET) 5-325 MG tablet Take 1-2 tablets by mouth every 4 (four) hours as needed. 09/09/21   Orpah Greek, MD      Allergies    Chocolate, Penicillins, and Bee venom    Review  of Systems   Review of Systems  Constitutional:  Negative for fever.  Eyes:  Negative for visual disturbance.  Respiratory:  Positive for shortness of breath.   Cardiovascular:  Positive for chest pain.  Gastrointestinal:  Positive for nausea.  Genitourinary:  Positive for dysuria.  Musculoskeletal:  Positive for back pain. Negative for arthralgias.  Skin:  Negative for rash.  Neurological:  Positive for headaches.    Physical Exam Updated Vital Signs BP 113/63 (BP Location: Left Arm)   Pulse (!) 117   Temp 99.5 F (37.5 C) (Oral)   Resp (!) 24   Ht 5\' 4"  (1.626 m)   Wt 78.5 kg   SpO2 96%   BMI 29.70 kg/m  Physical Exam Vitals and nursing note reviewed.  Constitutional:      General: He is not in acute distress.    Appearance: He is well-developed.  HENT:     Head: Normocephalic and atraumatic.  Eyes:     Conjunctiva/sclera: Conjunctivae normal.  Cardiovascular:     Rate and Rhythm: Regular rhythm. Tachycardia present.     Heart sounds: Normal heart sounds. No murmur heard. Pulmonary:     Effort: Pulmonary effort is normal. No respiratory distress.     Breath sounds: Normal breath sounds.  Abdominal:     Palpations: Abdomen is soft.     Tenderness: There is abdominal tenderness (ruq).  Musculoskeletal:  General: No swelling. Normal range of motion.     Cervical back: Neck supple.     Right lower leg: No tenderness. No edema.     Left lower leg: No tenderness. No edema.  Skin:    General: Skin is warm and dry.     Capillary Refill: Capillary refill takes less than 2 seconds.  Neurological:     General: No focal deficit present.     Mental Status: He is alert.     ED Results / Procedures / Treatments   Labs (all labs ordered are listed, but only abnormal results are displayed) Labs Reviewed  CBC - Abnormal; Notable for the following components:      Result Value   WBC 23.1 (*)    All other components within normal limits  COMPREHENSIVE METABOLIC  PANEL - Abnormal; Notable for the following components:   CO2 20 (*)    Glucose, Bld 138 (*)    Calcium 8.8 (*)    Albumin 3.0 (*)    All other components within normal limits  LACTIC ACID, PLASMA - Abnormal; Notable for the following components:   Lactic Acid, Venous 2.3 (*)    All other components within normal limits  BRAIN NATRIURETIC PEPTIDE - Abnormal; Notable for the following components:   B Natriuretic Peptide 165.3 (*)    All other components within normal limits  URINALYSIS, ROUTINE W REFLEX MICROSCOPIC - Abnormal; Notable for the following components:   APPearance CLOUDY (*)    Hgb urine dipstick SMALL (*)    Protein, ur >=300 (*)    All other components within normal limits  URINALYSIS, MICROSCOPIC (REFLEX) - Abnormal; Notable for the following components:   Bacteria, UA MANY (*)    All other components within normal limits  RESP PANEL BY RT-PCR (FLU A&B, COVID) ARPGX2  CULTURE, BLOOD (ROUTINE X 2)  CULTURE, BLOOD (ROUTINE X 2)  LIPASE, BLOOD  LACTIC ACID, PLASMA  TROPONIN I (HIGH SENSITIVITY)  TROPONIN I (HIGH SENSITIVITY)    EKG None  Radiology US Venous Img Upper Left (DVT Study)  Result Date: 04/19/2022 CLINICAL DATA:  Possible thrombus seen on recent CT imaging EXAM: LEFT UPPER EXTREMITY VENOUS DOPPLER ULTRASOUND TECHNIQUE: Gray-scale sonography with graded compression, as well as color Doppler and duplex ultrasound were performed to evaluate the upper extremity deep venous system from the level of the subclavian vein and including the jugular, axillary, basilic, radial, ulnar and upper cephalic vein. Spectral Doppler was utilized to evaluate flow at rest and with distal augmentation maneuvers. COMPARISON:  None Available. FINDINGS: Contralateral Subclavian Vein: Respiratory phasicity is normal and symmetric with the symptomatic side. No evidence of thrombus. Normal compressibility. Internal Jugular Vein: No evidence of thrombus. Normal compressibility,  respiratory phasicity and response to augmentation. Subclavian Vein: No evidence of thrombus. Normal compressibility, respiratory phasicity and response to augmentation. Axillary Vein: No evidence of thrombus. Normal compressibility, respiratory phasicity and response to augmentation. Cephalic Vein: No evidence of thrombus. Normal compressibility, respiratory phasicity and response to augmentation. Basilic Vein: No evidence of thrombus. Normal compressibility, respiratory phasicity and response to augmentation. Brachial Veins: No evidence of thrombus. Normal compressibility, respiratory phasicity and response to augmentation. Radial Veins: No evidence of thrombus. Normal compressibility, respiratory phasicity and response to augmentation. Ulnar Veins: No evidence of thrombus. Normal compressibility, respiratory phasicity and response to augmentation. Venous Reflux:  None visualized. Other Findings:  None visualized. IMPRESSION: No evidence of DVT within the left upper extremity. Electronically Signed   By: Jacqulynn Cadet  M.D.   On: 04/19/2022 14:50   CT Angio Chest/Abd/Pel for Dissection W and/or W/WO  Result Date: 04/19/2022 CLINICAL DATA:  Acute aortic syndrome suspected. Chest pain, back pain, cough. Nausea, headache, shortness of breath. The EXAM: CT ANGIOGRAPHY CHEST, ABDOMEN AND PELVIS TECHNIQUE: Non-contrast CT of the chest was initially obtained. Multidetector CT imaging through the chest, abdomen and pelvis was performed using the standard protocol during bolus administration of intravenous contrast. Multiplanar reconstructed images and MIPs were obtained and reviewed to evaluate the vascular anatomy. RADIATION DOSE REDUCTION: This exam was performed according to the departmental dose-optimization program which includes automated exposure control, adjustment of the mA and/or kV according to patient size and/or use of iterative reconstruction technique. CONTRAST:  187mL OMNIPAQUE IOHEXOL 350 MG/ML  SOLN COMPARISON:  Chest x-ray on 04/19/2022 FINDINGS: CTA CHEST FINDINGS Cardiovascular: Heart is normal in size. No pericardial effusion. No evidence for aortic dissection or aneurysm. The pulmonary arteries are normal in appearance accounting for contrast bolus timing. Multiple collaterals are identified in the LEFT shoulder region. There is possible thrombus within the LOWER LEFT internal jugular vein (6:1). Possible thrombus versus mixing of opacified and non-opacified blood within the LEFT subclavian/superior vena cava. Recommend further evaluation with LEFT UPPER extremity venous doppler. Mediastinum/Nodes: Multiple thyroid nodules are identified. Within the isthmus, the largest is 1.7 centimeters (6:10). Additional thyroid nodules are identified in the LEFT lobe. Mediastinal adenopathy noted, largest RIGHT paratracheal lymph node measures 1.2 centimeters. There is confluent opacity in the RIGHT hilar region, precluding evaluation for discrete lymph nodes. However, adenopathy in this region is suspected. No significant LEFT hilar adenopathy. Lungs/Pleura: There is masslike consolidation of the RIGHT UPPER lobe and RIGHT middle lobe, with early cavitation noted in these regions. The involved area is estimated to measure 9.1 x 10.1 x 5.4 centimeters. Consolidation and/or atelectasis present within the RIGHT LOWER lobe. Ground-glass pulmonary nodule in the RIGHT LOWER lobe is 5 millimeters on image 7:26. LEFT lung is clear. No pleural effusions. Musculoskeletal: No chest wall abnormality. No acute or significant osseous findings. Review of the MIP images confirms the above findings. CTA ABDOMEN AND PELVIS FINDINGS VASCULAR Aorta: Normal caliber aorta without aneurysm, dissection, vasculitis or significant stenosis. Celiac: Patent without evidence of aneurysm, dissection, vasculitis or significant stenosis. SMA: Patent without evidence of aneurysm, dissection, vasculitis or significant stenosis. Renals: Both renal  arteries are patent without evidence of aneurysm, dissection, vasculitis, fibromuscular dysplasia or significant stenosis. IMA: Patent without evidence of aneurysm, dissection, vasculitis or significant stenosis. Inflow: Patent without evidence of aneurysm, dissection, vasculitis or significant stenosis. Veins: No obvious venous abnormality within the limitations of this arterial phase study. Review of the MIP images confirms the above findings. NON-VASCULAR Hepatobiliary: No focal liver abnormality is seen. No gallstones, gallbladder wall thickening, or biliary dilatation. Pancreas: Unremarkable. No pancreatic ductal dilatation or surrounding inflammatory changes. Spleen: Normal in size without focal abnormality. Adrenals/Urinary Tract: Normal adrenal glands. Bilateral renal cysts, largest in the RIGHT kidney measuring 2.1 centimeters. No further imaging is recommended. No hydronephrosis. Ureters are unremarkable. The bladder and visualized portion of the urethra are normal. Stomach and small bowel loops are normal in appearance. The appendix is well seen and normal in appearance. Colon is unremarkable. Lymphatic: Unremarkable. Reproductive: Prostatic calcifications noted. Other: No ascites.  Small fat containing bilateral inguinal hernias. Musculoskeletal: No acute or significant osseous findings. Review of the MIP images confirms the above findings. IMPRESSION: 1. Masslike consolidation of the RIGHT UPPER lobe and RIGHT middle lobe, with early cavitation  present. The involved area is estimated to measure 9.1 x 10.1 x 5.4 centimeters. Although the findings are suspicious for malignancy, the rapid change since prior chest x-ray performed on 09/08/2021 suggest infectious/inflammatory causes. Necrotizing infection should be excluded. 2. Discrete 5 millimeter ground-glass pulmonary nodule in the RIGHT LOWER lobe. 3. Mediastinal and RIGHT hilar adenopathy. 4. No evidence for aortic dissection or aneurysm. 5. Possible  thrombus within the LEFT internal jugular, subclavian vein, and superior vena cava. Recommend further evaluation with LEFT UPPER extremity venous Doppler exam. 6. Bilateral renal cysts. No follow-up imaging is recommended. JACR 2018 Feb; 264-273, Management of the Incidental RenalMass on CT, Radio[employer] 2021; 814-848, Bosniak Classification of Cystic Renal Masses, Version 2019. 7. Normal appendix. 8. Small fat containing bilateral inguinal hernias. 9. Multiple thyroid nodules, the largest measuring 1.7 centimeters. Recommend non-emergent thyroid ultrasound. Reference: J Am Coll Radiol. 2015 Feb;12(2): 143-50 Electronically Signed   By: Nolon Nations M.D.   On: 04/19/2022 13:42   DG Chest Portable 1 View  Result Date: 04/19/2022 CLINICAL DATA:  Shortness of breath with cough and headache. EXAM: PORTABLE CHEST 1 VIEW COMPARISON:  Chest radiograph 09/07/2018 FINDINGS: The cardiomediastinal silhouette is normal. There is confluent opacity projecting over the right lower lobe. The right upper lobe is well-aerated. The left lung is clear. There is a small right pleural effusion. There is no significant left effusion. There is no pneumothorax There is no acute osseous abnormality. IMPRESSION: Confluent right lower lobe opacity raising concern for pneumonia with small parapneumonic effusion. Recommend follow-up radiographs in 3-4 weeks to ensure resolution. Electronically Signed   By: Valetta Mole M.D.   On: 04/19/2022 11:13    Procedures .Critical Care  Performed by: Hayden Rasmussen, MD Authorized by: Hayden Rasmussen, MD   Critical care provider statement:    Critical care time (minutes):  45   Critical care time was exclusive of:  Separately billable procedures and treating other patients   Critical care was necessary to treat or prevent imminent or life-threatening deterioration of the following conditions:  Respiratory failure and sepsis   Critical care was time spent personally by me on the  following activities:  Development of treatment plan with patient or surrogate, discussions with consultants, evaluation of patient's response to treatment, examination of patient, obtaining history from patient or surrogate, ordering and performing treatments and interventions, ordering and review of laboratory studies, ordering and review of radiographic studies, pulse oximetry, re-evaluation of patient's condition and review of old charts   I assumed direction of critical care for this patient from another provider in my specialty: no       Medications Ordered in ED Medications  morphine (PF) 4 MG/ML injection 4 mg (has no administration in time range)  aspirin chewable tablet 324 mg (has no administration in time range)  ondansetron (ZOFRAN) injection 4 mg (has no administration in time range)  sodium chloride 0.9 % bolus 500 mL (has no administration in time range)    ED Course/ Medical Decision Making/ A&P Clinical Course as of 04/19/22 1738  Mon Apr 19, 2022  1047 Chest x-ray interpreted by me as right middle lobe consolidation.  EKG read by computer as acute inferior MI.  To my reading he has new right bundle as opposed to his prior incomplete right bundle [MB]  1425 CT showing necrotizing possible cavitary in right lung could be infectious.  Also concern for possible thrombosis of left jugular or subclavian.  I have ordered an ultrasound here.  Discussed with [facility] hospitalist Dr. Rogers Blocker who will put in for a bed. [MB]    Clinical Course User Index [MB] Hayden Rasmussen, MD                           Medical Decision Making Amount and/or Complexity of Data Reviewed Labs: ordered. Radiology: ordered.  Risk OTC drugs. Prescription drug management. Decision regarding hospitalization.   This patient complains of chest pain right upper quadrant pain fevers chills cough shortness of breath; this involves an extensive number of treatment Options and is a complaint that carries  with it a high risk of complications and morbidity. The differential includes sepsis, COVID, flu, pneumonia, pneumothorax, vascular, PE, biliary colic  I ordered, reviewed and interpreted labs, which included CBC with markedly elevated white count stable hemoglobin, chemistries with low bicarb, lactate elevated cleared with fluids, troponins flat, COVID and flu negative  I ordered medication IV fluids and pain medication and IV antibiotics and reviewed PMP when indicated. I ordered imaging studies which included chest x-ray and CT angio chest abdomen and pelvis , left upper extremity duplex and I independently    visualized and interpreted imaging which showed acute cavitary pneumonia on right, possible subclavian thrombosis on left not seen on ultrasound Additional history obtained from patient's fianc Previous records obtained and reviewed in epic no recent admissions I consulted Dr. Rogers Blocker [facility] hospitalist and discussed lab and imaging findings and discussed disposition.  Cardiac monitoring reviewed, sinus tachycardia Social determinants considered, ongoing tobacco use Critical Interventions: Initiation of fluids and antibiotics for patient's SIRS criteria  After the interventions stated above, I reevaluated the patient and found patient to be more improved as far as his breathing and pain level.  Still tachycardic with soft blood pressures. Admission and further testing considered, patient would benefit from mission for further work-up and stabilization.  Patient in agreement with plan for admission.         Final Clinical Impression(s) / ED Diagnoses Final diagnoses:  Pneumonia of right middle lobe due to infectious organism  SIRS (systemic inflammatory response syndrome) ([place])    Rx / DC Orders ED Discharge Orders     None         Hayden Rasmussen, MD 04/19/22 1742

## 2022-04-19 NOTE — Progress Notes (Signed)
Plan of Care Note for accepted transfer   Patient: Scott Carrillo MRN: [id number]   DOA: 04/19/2022  Facility requesting transfer: [facility] Requesting Provider: Dr. Melina Copa  Reason for transfer: pneumonia Facility course: 46 year old with no significant medical history except tobacco abuse who presented to ED with complaints of right sided chest pain/ RUQ pain, cough, fever and chills since Saturday.   Vitals: RR: 33, HR: 103 oxygen: 96%RA. Afebrile Pertinent labs: wbc: 23.1, bnp: 165,  Lactic acid: 2.3>1.5 CXR: confluent RLL opacity raising concern for PNA with small parapneumonic effusion.  CTA chest/abdo/pelvis; 1. Masslike consolidation of the RIGHT UPPER lobe and RIGHT middle lobe, with early cavitation present. The involved area is estimated to measure 9.1 x 10.1 x 5.4 centimeters. Although the findings are suspicious for malignancy, the rapid change since prior chest x-ray performed on 09/08/2021 suggest infectious/inflammatory causes. Necrotizing infection should be excluded. 2. Discrete 5 millimeter ground-glass pulmonary nodule in the RIGHT LOWER lobe. 3. Mediastinal and RIGHT hilar adenopathy. Also has possible thrombus In left jugular [place]. Multiple thyroid nodules: rec. Thyroid US, small fat, containing bilateral inguinal hernias.   In ED: given ASA, tylenol, levaquin, morphine, zofran and NS bolus.  BC obtained.   Plan of care: The patient is accepted for admission to Progressive unit, at [facility]  -f/u on US imaging for possibly thrombosed left jugular [place].  -levaquin, anaphylaxis to PCN, unsure if had cephalosporin   Author: Orma Flaming, MD 04/19/2022  Check [email or web address] for on-call coverage.  Nursing staff, Please call [place] number on Amion as soon as patient's arrival, so appropriate admitting provider can evaluate the pt.

## 2022-04-19 NOTE — ED Notes (Signed)
Patient transported to CT 

## 2022-04-19 NOTE — H&P (Signed)
History and Physical    Scott Carrillo [id number] DOB: 06-30-1976 DOA: 04/19/2022  PCP: Pcp, No  Patient coming from: Home  I have personally briefly reviewed patient's old medical records in [place]  Chief Complaint: Chest pain, cough, dyspnea  HPI: Scott Carrillo is a 46 y.o. male with no significant medical history who presented to the ED for evaluation of right upper chest and back pain associated with cough and shortness of breath.  Patient began feeling fatigued 1 week ago.  He works with [employer] and was still able to go into work however several days ago he developed right-sided chest and upper back pain.  He has been short of breath.  He has a cough with chest congestion but has not been able to bring up much sputum.  Today he developed diaphoresis and chills.  He denies any aspiration event.  His wife notes that he was snoring more than usual last night.  He reports smoking cigarettes 1 pack/day at least since age of 70.  He denies any history of incarceration or homelessness.  He is unaware of any recent sick contacts.  He denies any history of pneumonia in the past.  He does not take any medications regularly.  [facility] ED Course  Labs/Imaging on admission: I have personally reviewed following labs and imaging studies.  Initial vitals showed BP 113/63, pulse 109, RR 35, temp 99.5 F, SPO2 95% on room air.  Labs show WBC 23.1, hemoglobin 13.6, platelets 200,000, sodium 135, potassium 3.5, bicarb 20, BUN 15, creatinine 0.94, serum glucose 138, LFTs within normal limits, BNP 165.3, lactic acid 2.3 > 1.5, lipase 32, troponin 11 > 9.  SARS-CoV-2 and influenza PCR negative.  Blood cultures in process.  Portable chest x-ray showed confluent right lower lobe opacity with small parapneumonic effusion.  CTA chest/abdomen/pelvis showed masslike consolidation of the right upper and right middle lobes with early cavitation.  Involved area estimated to measure 9.1 x  10.1 x 5.4 cm.  Discrete 5 mm groundglass pulmonary nodule in the right lower lobe noted.  Mediastinal and right hilar adenopathy seen.  No evidence for aortic dissection or aneurysm.  Possible thrombus within the left IJ, subclavian vein, and SVC seen.  Bilateral renal cysts, normal appendix, small fat-containing bilateral inguinal hernias, and multiple thyroid nodules also reported.  LUE venous ultrasound was negative for evidence of DVT.  Due to reported penicillin allergy patient was given IV Levaquin.  He received 500 cc normal saline, fentanyl, morphine in the ED.  The hospitalist service was consulted to admit for further evaluation and management.  Review of Systems: All systems reviewed and are negative except as documented in history of present illness above.   History reviewed. No pertinent past medical history.  Past Surgical History:  Procedure Laterality Date   WISDOM TOOTH EXTRACTION      Social History:  reports that he has been smoking. He has been smoking an average of 1 pack per day. He has never used smokeless tobacco. He reports current drug use. Drug: Marijuana. He reports that he does not drink alcohol.  Allergies  Allergen Reactions   Chocolate Anaphylaxis   Penicillins     Anaphylaxis    Bee Venom Hives    No family history on file.   Prior to Admission medications   Medication Sig Start Date End Date Taking? Authorizing Provider  doxycycline (VIBRAMYCIN) 100 MG capsule Take 1 capsule (100 mg total) by mouth 2 (two) times daily. 10/17/21  Redwine, Madison A, PA-C  ibuprofen (ADVIL) 800 MG tablet Take 1 tablet (800 mg total) by mouth every 6 (six) hours as needed for moderate pain. 09/09/21   Orpah Greek, MD  omeprazole (PRILOSEC OTC) 20 MG tablet Take 20 mg by mouth daily as needed (heartburn).    [provider]  oxyCODONE-acetaminophen (PERCOCET) 5-325 MG tablet Take 1-2 tablets by mouth every 4 (four) hours as needed. 09/09/21    Orpah Greek, MD    Physical Exam: Vitals:   04/19/22 1834 04/19/22 1914 04/19/22 2030 04/19/22 2236  BP:   116/67 107/64  Pulse: (!) 106 (!) 106 (!) 109 (!) 109  Resp: (!) 9 (!) 37 (!) 31 20  Temp:      TempSrc:      SpO2: 95% 98% 95% 95%  Weight:      Height:       Constitutional: Sitting up in bed, appears fatigued but in NAD, calm. Eyes: EOMI, lids and conjunctivae normal ENMT: Mucous membranes are moist. Posterior pharynx clear of any exudate or lesions.Normal dentition.  Neck: normal, supple, no masses. Respiratory: Diminished breath sounds with inspiratory crackles right upper lung field, CTA on the left.. Normal respiratory effort. No accessory muscle use.  Cardiovascular: Regular rate and rhythm, no murmurs / rubs / gallops. No extremity edema. 2+ pedal pulses. Abdomen: no tenderness, no masses palpated. Musculoskeletal: no clubbing / cyanosis. No joint deformity upper and lower extremities. Good ROM, no contractures. Normal muscle tone.  Skin: no rashes, lesions, ulcers. No induration Neurologic: Sensation intact. Strength 5/5 in all 4.  Psychiatric: Alert and oriented x 3. Normal mood.   EKG: Personally reviewed. Sinus tachycardia, rate 111, RBBB.  RBBB and tachycardia new when compared to prior.  Assessment/Plan Principal Problem:   Sepsis due to right upper and middle lobe cavitary pneumonia (HCC) Active Problems:   Lung nodule   Multiple thyroid nodules   Tobacco use   Scott Carrillo is a 46 y.o. male with no significant medical history who is admitted with sepsis due to right upper and middle lobe cavitary pneumonia with differential including malignancy.  Assessment and Plan: * Sepsis due to right upper and middle lobe cavitary pneumonia ([place]) Presenting with leukocytosis, tachycardia, tachypnea.  CTA chest with masslike consolidations of the RUL and RML with early cavitation.  Concern is for necrotizing infection although malignancy also on  differential. -Continue IV Levaquin -Add IV Flagyl -Follow blood cultures, obtain sputum culture -Supplemental oxygen as needed  Tobacco use Smokes 1 pack/day at least since age 53.  Advised on smoking cessation.  He declines nicotine patch.  Multiple thyroid nodules Multiple thyroid nodules, largest measuring 1.7 cm seen on CT.  Nonemergent thyroid ultrasound recommended for follow-up.  Lung nodule 5 mm groundglass pulmonary nodule in the right lower lobe noted on CT imaging.  CTA with possible thrombus within left IJ, subclavian vein, SVC: Follow-up LUE venous ultrasound is negative for evidence of DVT within the LUE.  DVT prophylaxis: enoxaparin (LOVENOX) injection 40 mg Start: 04/20/22 1400 Code Status: Full code, confirmed with patient on admission Family Communication: Spouse at bedside Disposition Plan: From home, dispo pending clinical progress Consults called: None Severity of Illness: The appropriate patient status for this patient is INPATIENT. Inpatient status is judged to be reasonable and necessary in order to provide the required intensity of service to ensure the patient's safety. The patient's presenting symptoms, physical exam findings, and initial radiographic and laboratory data in the context of their chronic  comorbidities is felt to place them at high risk for further clinical deterioration. Furthermore, it is not anticipated that the patient will be medically stable for discharge from the hospital within 2 midnights of admission.   * I certify that at the point of admission it is my clinical judgment that the patient will require inpatient hospital care spanning beyond 2 midnights from the point of admission due to high intensity of service, high risk for further deterioration and high frequency of surveillance required.Zada Finders MD [facility]  If 7PM-7AM, please contact night-coverage [email or web address]  04/20/2022, 12:56 AM

## 2022-04-19 NOTE — ED Notes (Signed)
Patient report given to [place] on 5N.

## 2022-04-20 ENCOUNTER — Telehealth: Payer: Self-pay | Admitting: Emergency Medicine

## 2022-04-20 ENCOUNTER — Other Ambulatory Visit: Payer: Self-pay

## 2022-04-20 DIAGNOSIS — K4020 Bilateral inguinal hernia, without obstruction or gangrene, not specified as recurrent: Secondary | ICD-10-CM

## 2022-04-20 DIAGNOSIS — N281 Cyst of kidney, acquired: Secondary | ICD-10-CM

## 2022-04-20 DIAGNOSIS — J189 Pneumonia, unspecified organism: Principal | ICD-10-CM

## 2022-04-20 DIAGNOSIS — Z720 Tobacco use: Secondary | ICD-10-CM | POA: Diagnosis present

## 2022-04-20 LAB — BLOOD CULTURE ID PANEL (REFLEXED) - BCID2

## 2022-04-20 LAB — CBC
HCT: 33.7 % — ABNORMAL LOW (ref 39.0–52.0)
Hemoglobin: 11.6 g/dL — ABNORMAL LOW (ref 13.0–17.0)
MCH: 30.8 pg (ref 26.0–34.0)
MCHC: 34.4 g/dL (ref 30.0–36.0)
MCV: 89.4 fL (ref 80.0–100.0)
Platelets: 175 10*3/uL (ref 150–400)
RBC: 3.77 MIL/uL — ABNORMAL LOW (ref 4.22–5.81)
RDW: 13.8 % (ref 11.5–15.5)
WBC: 15.3 10*3/uL — ABNORMAL HIGH (ref 4.0–10.5)
nRBC: 0 % (ref 0.0–0.2)

## 2022-04-20 LAB — BASIC METABOLIC PANEL
Anion gap: 6 (ref 5–15)
BUN: 12 mg/dL (ref 6–20)
CO2: 21 mmol/L — ABNORMAL LOW (ref 22–32)
Calcium: 8.7 mg/dL — ABNORMAL LOW (ref 8.9–10.3)
Chloride: 110 mmol/L (ref 98–111)
Creatinine, Ser: 0.95 mg/dL (ref 0.61–1.24)
GFR, Estimated: >60 mL/min (ref >60)
Glucose, Bld: 110 mg/dL — ABNORMAL HIGH (ref 70–99)
Potassium: 3.8 mmol/L (ref 3.5–5.1)
Sodium: 137 mmol/L (ref 135–145)

## 2022-04-20 LAB — HIV ANTIBODY (ROUTINE TESTING W REFLEX): HIV Screen 4th Generation wRfx: NONREACTIVE

## 2022-04-20 LAB — STREP PNEUMONIAE URINARY ANTIGEN: Strep Pneumo Urinary Antigen: POSITIVE — AB

## 2022-04-20 LAB — PROCALCITONIN: Procalcitonin: 1.96 ng/mL

## 2022-04-20 MED ORDER — IPRATROPIUM-ALBUTEROL 0.5-2.5 (3) MG/3ML IN SOLN: 3.0000 mL | Freq: Four times a day (QID) | RESPIRATORY_TRACT | Status: DC | PRN

## 2022-04-20 MED ORDER — HYDROCOD POLI-CHLORPHE POLI ER 10-8 MG/5ML PO SUER
5.0000 mL | Freq: Two times a day (BID) | ORAL | Status: DC
  Administered 2022-04-20 – 2022-04-23 (×7): 5 mL via ORAL
  Filled 2022-04-20 (×7): qty 5

## 2022-04-20 MED ORDER — ONDANSETRON HCL 4 MG PO TABS: 4.0000 mg | ORAL_TABLET | Freq: Four times a day (QID) | ORAL | Status: DC | PRN

## 2022-04-20 MED ORDER — OXYCODONE HCL 5 MG PO TABS: 5.0000 mg | ORAL_TABLET | ORAL | Status: DC | PRN

## 2022-04-20 MED ORDER — SODIUM CHLORIDE 0.9% FLUSH
3.0000 mL | Freq: Two times a day (BID) | INTRAVENOUS | Status: DC
  Administered 2022-04-20 – 2022-04-23 (×8): 3 mL via INTRAVENOUS

## 2022-04-20 MED ORDER — HYDROMORPHONE HCL 1 MG/ML IJ SOLN
1.0000 mg | INTRAMUSCULAR | Status: DC | PRN
  Administered 2022-04-20 – 2022-04-21 (×3): 1 mg via INTRAVENOUS
  Filled 2022-04-20 (×3): qty 1

## 2022-04-20 MED ORDER — PANTOPRAZOLE SODIUM 40 MG PO TBEC
40.0000 mg | DELAYED_RELEASE_TABLET | Freq: Every day | ORAL | Status: DC
  Administered 2022-04-21 – 2022-04-22 (×2): 40 mg via ORAL
  Filled 2022-04-20 (×2): qty 1

## 2022-04-20 MED ORDER — SENNOSIDES-DOCUSATE SODIUM 8.6-50 MG PO TABS: 1.0000 | ORAL_TABLET | Freq: Every evening | ORAL | Status: DC | PRN

## 2022-04-20 MED ORDER — ENOXAPARIN SODIUM 40 MG/0.4ML IJ SOSY
40.0000 mg | PREFILLED_SYRINGE | INTRAMUSCULAR | Status: DC
  Administered 2022-04-20 – 2022-04-22 (×3): 40 mg via SUBCUTANEOUS
  Filled 2022-04-20 (×3): qty 0.4

## 2022-04-20 MED ORDER — ACETAMINOPHEN 650 MG RE SUPP: 650.0000 mg | Freq: Four times a day (QID) | RECTAL | Status: DC | PRN

## 2022-04-20 MED ORDER — ONDANSETRON HCL 4 MG/2ML IJ SOLN: 4.0000 mg | Freq: Four times a day (QID) | INTRAMUSCULAR | Status: DC | PRN

## 2022-04-20 MED ORDER — ACETAMINOPHEN 325 MG PO TABS
650.0000 mg | ORAL_TABLET | Freq: Four times a day (QID) | ORAL | Status: DC | PRN
  Administered 2022-04-20 – 2022-04-23 (×4): 650 mg via ORAL
  Filled 2022-04-20 (×4): qty 2

## 2022-04-20 MED ORDER — OXYCODONE HCL 5 MG PO TABS
10.0000 mg | ORAL_TABLET | ORAL | Status: DC | PRN
  Administered 2022-04-20 – 2022-04-22 (×3): 10 mg via ORAL
  Filled 2022-04-20 (×3): qty 2

## 2022-04-20 MED ORDER — GUAIFENESIN ER 600 MG PO TB12
600.0000 mg | ORAL_TABLET | Freq: Two times a day (BID) | ORAL | Status: DC
  Administered 2022-04-20 – 2022-04-23 (×7): 600 mg via ORAL
  Filled 2022-04-20 (×8): qty 1

## 2022-04-20 NOTE — Progress Notes (Signed)
[facility] Progress Note  Patient: Scott Carrillo     PPI:[id number]  DOA: 04/19/2022   PCP: Pcp, No       Brief hospital course: 46 y/o male who smoke cigarettes presents with chest pain, back pain, cough and chest congestion.  In ED > temp 99.5, RR in 30-40 range, WBC 23.1, Lactic acid 2.3. ]  CTA chest> RUL and RML mass-like consolidation with early cavitation. Possible thrombus in left IJ, left subclavian, SVC. 5 mm lung nodule in RLL Mediastinal and R hilar adenopathy  LUE Ultrasound> not DVT  Strep pneumo urinary angtigen + Blood cultures> gr + cocci in pair in anaerobic bottle only  Subjective:  Has cough with right sided chest pain when he coughs, takes a deep breath and moves.   Assessment and Plan: Principal Problem:   Sepsis due to right upper and middle lobe cavitary pneumonia    Bacteremia- likely Strep pneumo - cont Levofloxacin and Metronidazole - has pleuritic chest pain and has problems with heart burn- avoid NSAIDs- cont Dilaudid- add Oxycodone - add Tussionex and IS - appreciate PCCM consult- plan to repeat CT chest in 6-8 wks  Active Problems:  Heartburn - start Protonix daily    Lung nodule   Multiple thyroid nodules - outpt f/u - discussed with patient    Tobacco use - he does not feel he needs a nicotine patch    B/l Inguinal hernias containing fat - small and not noticeable to patient  B/l renal cysts    Code Status: Full Code DVT prophylaxis:  enoxaparin (LOVENOX) injection 40 mg Start: 04/20/22 1400  Consultants: PCCM Level of Care: Level of care: Progressive  Objective:   Vitals:   04/19/22 2236 04/20/22 0535 04/20/22 0816 04/20/22 1448  BP: 107/64 112/67 111/72   Pulse: (!) 109  92   Resp: 20  18   Temp:   98.8 F (37.1 C) 98.3 F (36.8 C)  TempSrc:   Oral Oral  SpO2: 95%  98%   Weight:      Height:       Filed Weights   04/19/22 1027  Weight: 78.5 kg   Exam: General exam: Appears comfortable  HEENT: oral  mucosa moist Respiratory system: crackles in R lung field, poor inspiratory effort Cardiovascular system: S1 & S2 heard  Gastrointestinal system: Abdomen soft, non-tender, nondistended. Normal bowel sounds   Extremities: No cyanosis, clubbing or edema Psychiatry:  Mood & affect appropriate.    Imaging and lab data was personally reviewed    CBC: Recent Labs  Lab 04/19/22 1035 04/20/22 0247  WBC 23.1* 15.3*  HGB 13.6 11.6*  HCT 39.5 33.7*  MCV 88.6 89.4  PLT 200 063   Basic Metabolic Panel: Recent Labs  Lab 04/19/22 1035 04/20/22 0247  NA 135 137  K 3.5 3.8  CL 106 110  CO2 20* 21*  GLUCOSE 138* 110*  BUN 15 12  CREATININE 0.94 0.95  CALCIUM 8.8* 8.7*   GFR: Estimated Creatinine Clearance: 93.9 mL/min (by C-G formula based on SCr of 0.95 mg/dL).  Scheduled Meds:  chlorpheniramine-HYDROcodone  5 mL Oral Q12H   enoxaparin (LOVENOX) injection  40 mg Subcutaneous Q24H   guaiFENesin  600 mg Oral BID   [START ON 04/21/2022] pantoprazole  40 mg Oral QAC lunch   sodium chloride flush  3 mL Intravenous Q12H   Continuous Infusions:  levofloxacin (LEVAQUIN) IV 750 mg (04/20/22 1022)   metronidazole 500 mg (04/20/22 1159)     LOS: 1  day   Author: Debbe Odea  04/20/2022 5:00 PM

## 2022-04-20 NOTE — Assessment & Plan Note (Signed)
Smokes 1 pack/day at least since age 46.  Advised on smoking cessation.  He declines nicotine patch.

## 2022-04-20 NOTE — Assessment & Plan Note (Signed)
5 mm groundglass pulmonary nodule in the right lower lobe noted on CT imaging.

## 2022-04-20 NOTE — Progress Notes (Signed)
PHARMACY - PHYSICIAN COMMUNICATION CRITICAL VALUE ALERT - BLOOD CULTURE IDENTIFICATION (BCID)  Scott Carrillo is an 46 y.o. male who presented to [facility] on 04/19/2022 with a chief complaint of cough and shortness of breath  Assessment:  Streptococcus pneumoniae  Name of physician (or Provider) Contacted: Sheran Luz  Current antibiotics: Levaquin and Flagyl  Changes to prescribed antibiotics recommended:   Patient had ceftriaxone previously, but documented anaphylaxis to Penicillin. Overnight MD wants to wait for day shift/ infectious disease team to make any changes.  Results for orders placed or performed during the hospital encounter of 04/19/22  Blood Culture ID Panel (Reflexed) (Collected: 04/19/2022 10:55 AM)  Result Value Ref Range   Enterococcus faecalis NOT DETECTED NOT DETECTED   Enterococcus Faecium NOT DETECTED NOT DETECTED   Listeria monocytogenes NOT DETECTED NOT DETECTED   Staphylococcus species NOT DETECTED NOT DETECTED   Staphylococcus aureus (BCID) NOT DETECTED NOT DETECTED   Staphylococcus epidermidis NOT DETECTED NOT DETECTED   Staphylococcus lugdunensis NOT DETECTED NOT DETECTED   Streptococcus species DETECTED (A) NOT DETECTED   Streptococcus agalactiae NOT DETECTED NOT DETECTED   Streptococcus pneumoniae DETECTED (A) NOT DETECTED   Streptococcus pyogenes NOT DETECTED NOT DETECTED   A.calcoaceticus-baumannii NOT DETECTED NOT DETECTED   Bacteroides fragilis NOT DETECTED NOT DETECTED   Enterobacterales NOT DETECTED NOT DETECTED   Enterobacter cloacae complex NOT DETECTED NOT DETECTED   Escherichia coli NOT DETECTED NOT DETECTED   Klebsiella aerogenes NOT DETECTED NOT DETECTED   Klebsiella oxytoca NOT DETECTED NOT DETECTED   Klebsiella pneumoniae NOT DETECTED NOT DETECTED   Proteus species NOT DETECTED NOT DETECTED   Salmonella species NOT DETECTED NOT DETECTED   Serratia marcescens NOT DETECTED NOT DETECTED   Haemophilus influenzae NOT DETECTED NOT DETECTED    Neisseria meningitidis NOT DETECTED NOT DETECTED   Pseudomonas aeruginosa NOT DETECTED NOT DETECTED   Stenotrophomonas maltophilia NOT DETECTED NOT DETECTED   Candida albicans NOT DETECTED NOT DETECTED   Candida auris NOT DETECTED NOT DETECTED   Candida glabrata NOT DETECTED NOT DETECTED   Candida krusei NOT DETECTED NOT DETECTED   Candida parapsilosis NOT DETECTED NOT DETECTED   Candida tropicalis NOT DETECTED NOT DETECTED   Cryptococcus neoformans/gattii NOT DETECTED NOT DETECTED    Scott Carrillo Scott Carrillo 04/20/2022  6:04 AM

## 2022-04-20 NOTE — Telephone Encounter (Signed)
Patient currently admitted to the hospital.  He needs a hospital follow-up visit with Mable Dara in 4 to 6 weeks.  Thank you

## 2022-04-20 NOTE — Assessment & Plan Note (Signed)
Multiple thyroid nodules, largest measuring 1.7 cm seen on CT.  Nonemergent thyroid ultrasound recommended for follow-up.

## 2022-04-20 NOTE — Hospital Course (Signed)
Scott Carrillo is a 46 y.o. male with no significant medical history who is admitted with sepsis due to right upper and middle lobe cavitary pneumonia with differential including malignancy.

## 2022-04-20 NOTE — Consult Note (Signed)
NAME:  Scott Carrillo, MRN:  [id number], DOB:  1975/11/24, LOS: 1 ADMISSION DATE:  04/19/2022, CONSULTATION DATE:  10/3 REFERRING MD:  Dr. Posey Pronto, CHIEF COMPLAINT:  Strep Pneumonia   History of Present Illness:  Patient is a 46 year old male with no PMH on file presents to [facility] ED on 10/2 with right-sided chest pain and SOB.  Patient states he has been having fatigue for about a week.  Developed right-sided chest pain/back pain a few days ago prior to admission.  Patient states he has been having some SOB and nonproductive cough.  On 10/2 patient began having chills and diaphoresis.  Patient came to [facility] ED for further work-up.  Patient states he does smoke a pack a day for 30 years. Denies weight loss, fever, night sweats. Patient states he had a concerning pulmonary nodule about 10 years ago seen on CT chest. Patient states that they repeated imaging and told him it was in remission and he quit following up after that.  Upon arrival to [facility] ED on 10/2, vital stable.  On room air with sats 95%.  COVID/flu negative.  CXR showing RLL opacity.  CTA chest/abdomen/pelvis showing masslike consolidation of RUL/RML with early cavitation; suspicious for malignancy; necrotizing infection should be excluded; pulmonary nodule 5 mm on RLL; mediastinal/right hilar adenopathy.  WBC 23.  Urine strep pneumonia positive.  Blood cultures showing strep pneumonia.  Patient started on Levaquin and Flagyl.  PCCM consulted.   Legionella and expectorated sputum pending.  Pertinent  Medical History  Tobacco abuse   Significant Hospital Events: Including procedures, antibiotic start and stop dates in addition to other pertinent events   10/2 admitted with strep pneumonia; concern for possible malignancy on CT chest; PCCM consulted  Interim History / Subjective:  See H&P  Objective   Blood pressure 111/72, pulse 92, temperature 98.8 F (37.1 C), temperature source Oral, resp. rate 18, height 5\' 4"  (1.626 m), weight 78.5  kg, SpO2 98 %.        Intake/Output Summary (Last 24 hours) at 04/20/2022 1009 Last data filed at 04/20/2022 0422 Gross per 24 hour  Intake 600.49 ml  Output 350 ml  Net 250.49 ml   Filed Weights   04/19/22 1027  Weight: 78.5 kg    Examination: General:   NAD HEENT: MM pink/moist; [place] in place Neuro: Aox3; MAE CV: s1s2, RRR, no m/r/g PULM:  crackles BS on [place] side; Left lung dim clear bs; [place] 2L GI: soft, bsx4 active  Extremities: warm/dry, no edema  Skin: no rashes or lesions appreciated   Resolved Hospital Problem list     Assessment & Plan:  RUL/RML cavitary strep pneumonia 5 mm RLL pulmonary nodule Hilar/right adenopathy -CTA chest 10/2 showing masslike consolidation of RUL/RML with early cavitation; suspicious for malignancy; necrotizing infection should be excluded; pulmonary nodule 5 mm on RLL; mediastinal/right hilar adenopathy.  P: -wean [place] for sats >92% -continue levaquin and flagyl -f/u legionella and expectorated sputum -consider bronch vs. Lymph node biopsy to r/o malignancy -Pulm toiletry: IS/Flutter -prn duoneb for wheezing  Tobacco abuse P: -smoking cessation counseling given   Best Practice (right click and "Reselect all SmartList Selections" daily)   Per primary  Labs   CBC: Recent Labs  Lab 04/19/22 1035 04/20/22 0247  WBC 23.1* 15.3*  HGB 13.6 11.6*  HCT 39.5 33.7*  MCV 88.6 89.4  PLT 200 423    Basic Metabolic Panel: Recent Labs  Lab 04/19/22 1035 04/20/22 0247  NA 135 137  K 3.5  3.8  CL 106 110  CO2 20* 21*  GLUCOSE 138* 110*  BUN 15 12  CREATININE 0.94 0.95  CALCIUM 8.8* 8.7*   GFR: Estimated Creatinine Clearance: 93.9 mL/min (by C-G formula based on SCr of 0.95 mg/dL). Recent Labs  Lab 04/19/22 1035 04/19/22 1036 04/19/22 1235 04/20/22 0247  PROCALCITON  --   --   --  1.96  WBC 23.1*  --   --  15.3*  LATICACIDVEN  --  2.3* 1.5  --     Liver Function Tests: Recent Labs  Lab 04/19/22 1035  AST 30  ALT  25  ALKPHOS 84  BILITOT 0.6  PROT 6.5  ALBUMIN 3.0*   Recent Labs  Lab 04/19/22 1035  LIPASE 32   No results for input(s): "AMMONIA" in the last 168 hours.  ABG No results found for: "PHART", "PCO2ART", "PO2ART", "HCO3", "TCO2", "ACIDBASEDEF", "O2SAT"   Coagulation Profile: No results for input(s): "INR", "PROTIME" in the last 168 hours.  Cardiac Enzymes: No results for input(s): "CKTOTAL", "CKMB", "CKMBINDEX", "TROPONINI" in the last 168 hours.  HbA1C: No results found for: "HGBA1C"  CBG: No results for input(s): "GLUCAP" in the last 168 hours.  Review of Systems:   Review of Systems  Constitutional:  Positive for chills, diaphoresis and malaise/fatigue. Negative for fever and weight loss.  Respiratory:  Positive for cough and shortness of breath. Negative for hemoptysis, sputum production and wheezing.   Cardiovascular:  Positive for chest pain.  Gastrointestinal:  Negative for constipation, diarrhea, nausea and vomiting.     Past Medical History:  He,  has no past medical history on file.   Surgical History:   Past Surgical History:  Procedure Laterality Date   WISDOM TOOTH EXTRACTION       Social History:   reports that he has been smoking. He has been smoking an average of 1 pack per day. He has never used smokeless tobacco. He reports current drug use. Drug: Marijuana. He reports that he does not drink alcohol.   Family History:  His family history is not on file.   Allergies Allergies  Allergen Reactions   Chocolate Anaphylaxis   Penicillins Anaphylaxis        Bee Venom Hives     Home Medications  Prior to Admission medications   Medication Sig Start Date End Date Taking? Authorizing Provider  Menthol, Topical Analgesic, (ICY HOT ORIGINAL PAIN RELIEF EX) Apply 1 application  topically 2 (two) times daily as needed (pain). Icy Hot roll on   Yes [provider]  naproxen sodium (ALEVE) 220 MG tablet Take 660 mg by mouth daily as needed  (pain).   Yes [provider]  oxyCODONE-acetaminophen (PERCOCET) 5-325 MG tablet Take 1-2 tablets by mouth every 4 (four) hours as needed. Patient taking differently: Take 1 tablet by mouth daily as needed (pain). 09/09/21  Yes Pollina, Canary Brim, MD         JD Anselm Lis  Pulmonary & Critical Care 04/20/2022, 10:09 AM  Please see [email or web address] for pager details.  From 7A-7P if no response, please call [phone]. After hours, please call ELink [phone].

## 2022-04-20 NOTE — Assessment & Plan Note (Signed)
Presenting with leukocytosis, tachycardia, tachypnea.  CTA chest with masslike consolidations of the RUL and RML with early cavitation.  Concern is for necrotizing infection although malignancy also on differential. -Continue IV Levaquin -Add IV Flagyl -Follow blood cultures, obtain sputum culture -Supplemental oxygen as needed

## 2022-04-21 ENCOUNTER — Inpatient Hospital Stay (HOSPITAL_COMMUNITY): Payer: Self-pay

## 2022-04-21 ENCOUNTER — Encounter (HOSPITAL_COMMUNITY): Payer: Self-pay | Admitting: Family Medicine

## 2022-04-21 DIAGNOSIS — I4891 Unspecified atrial fibrillation: Secondary | ICD-10-CM

## 2022-04-21 DIAGNOSIS — Z720 Tobacco use: Secondary | ICD-10-CM

## 2022-04-21 DIAGNOSIS — R911 Solitary pulmonary nodule: Secondary | ICD-10-CM

## 2022-04-21 DIAGNOSIS — R9431 Abnormal electrocardiogram [ECG] [EKG]: Secondary | ICD-10-CM

## 2022-04-21 LAB — ECHOCARDIOGRAM COMPLETE
Area-P 1/2: 5.13 cm2
Calc EF: 56.8 %
Height: 64 in
S' Lateral: 3.6 cm
Single Plane A2C EF: 60.5 %
Single Plane A4C EF: 54.2 %
Weight: 2768 oz

## 2022-04-21 LAB — BASIC METABOLIC PANEL
Anion gap: 9 (ref 5–15)
BUN: 11 mg/dL (ref 6–20)
CO2: 21 mmol/L — ABNORMAL LOW (ref 22–32)
Calcium: 8.7 mg/dL — ABNORMAL LOW (ref 8.9–10.3)
Chloride: 105 mmol/L (ref 98–111)
Creatinine, Ser: 0.98 mg/dL (ref 0.61–1.24)
GFR, Estimated: >60 mL/min (ref >60)
Glucose, Bld: 134 mg/dL — ABNORMAL HIGH (ref 70–99)
Potassium: 3.2 mmol/L — ABNORMAL LOW (ref 3.5–5.1)
Sodium: 135 mmol/L (ref 135–145)

## 2022-04-21 LAB — CBC
HCT: 37.5 % — ABNORMAL LOW (ref 39.0–52.0)
Hemoglobin: 13.1 g/dL (ref 13.0–17.0)
MCH: 31.2 pg (ref 26.0–34.0)
MCHC: 34.9 g/dL (ref 30.0–36.0)
MCV: 89.3 fL (ref 80.0–100.0)
Platelets: 214 10*3/uL (ref 150–400)
RBC: 4.2 MIL/uL — ABNORMAL LOW (ref 4.22–5.81)
RDW: 14 % (ref 11.5–15.5)
WBC: 7.3 10*3/uL (ref 4.0–10.5)
nRBC: 0 % (ref 0.0–0.2)

## 2022-04-21 LAB — LEGIONELLA PNEUMOPHILA SEROGP 1 UR AG: L. pneumophila Serogp 1 Ur Ag: NEGATIVE

## 2022-04-21 LAB — TSH: TSH: 3.039 u[IU]/mL (ref 0.350–4.500)

## 2022-04-21 LAB — EXPECTORATED SPUTUM ASSESSMENT W GRAM STAIN, RFLX TO RESP C

## 2022-04-21 LAB — MAGNESIUM: Magnesium: 1.7 mg/dL (ref 1.7–2.4)

## 2022-04-21 LAB — TROPONIN I (HIGH SENSITIVITY): Troponin I (High Sensitivity): 21 ng/L — ABNORMAL HIGH (ref <18)

## 2022-04-21 MED ORDER — LEVOFLOXACIN 500 MG PO TABS
750.0000 mg | ORAL_TABLET | Freq: Every day | ORAL | Status: DC
  Administered 2022-04-22 – 2022-04-23 (×2): 750 mg via ORAL
  Filled 2022-04-21 (×2): qty 2

## 2022-04-21 MED ORDER — METOPROLOL TARTRATE 12.5 MG HALF TABLET
12.5000 mg | ORAL_TABLET | Freq: Two times a day (BID) | ORAL | Status: DC
  Administered 2022-04-21: 12.5 mg via ORAL
  Filled 2022-04-21 (×2): qty 1

## 2022-04-21 MED ORDER — MAGNESIUM SULFATE 2 GM/50ML IV SOLN
2.0000 g | Freq: Once | INTRAVENOUS | Status: AC
  Administered 2022-04-21: 2 g via INTRAVENOUS
  Filled 2022-04-21: qty 50

## 2022-04-21 MED ORDER — AMIODARONE LOAD VIA INFUSION: 150.0000 mg | Freq: Once | INTRAVENOUS | Status: DC

## 2022-04-21 MED ORDER — AMIODARONE HCL IN DEXTROSE 360-4.14 MG/200ML-% IV SOLN: 30.0000 mg/h | INTRAVENOUS | Status: DC

## 2022-04-21 MED ORDER — POTASSIUM CHLORIDE 10 MEQ/100ML IV SOLN
10.0000 meq | INTRAVENOUS | Status: AC
  Administered 2022-04-21 (×4): 10 meq via INTRAVENOUS
  Filled 2022-04-21 (×3): qty 100

## 2022-04-21 MED ORDER — AMIODARONE LOAD VIA INFUSION
150.0000 mg | Freq: Once | INTRAVENOUS | Status: AC
  Administered 2022-04-21: 150 mg via INTRAVENOUS
  Filled 2022-04-21: qty 83.34

## 2022-04-21 MED ORDER — AMIODARONE HCL IN DEXTROSE 360-4.14 MG/200ML-% IV SOLN: 60.0000 mg/h | INTRAVENOUS | Status: DC

## 2022-04-21 MED ORDER — AMIODARONE HCL IN DEXTROSE 360-4.14 MG/200ML-% IV SOLN
60.0000 mg/h | INTRAVENOUS | Status: DC
  Administered 2022-04-21 (×2): 60 mg/h via INTRAVENOUS
  Filled 2022-04-21 (×2): qty 200

## 2022-04-21 MED ORDER — AMIODARONE HCL IN DEXTROSE 360-4.14 MG/200ML-% IV SOLN
30.0000 mg/h | INTRAVENOUS | Status: DC
  Administered 2022-04-21: 30 mg/h via INTRAVENOUS
  Filled 2022-04-21: qty 200

## 2022-04-21 MED ORDER — LEVOFLOXACIN IN D5W 750 MG/150ML IV SOLN
750.0000 mg | INTRAVENOUS | Status: AC
  Administered 2022-04-21: 750 mg via INTRAVENOUS

## 2022-04-21 MED ORDER — POTASSIUM CHLORIDE CRYS ER 20 MEQ PO TBCR: 40.0000 meq | EXTENDED_RELEASE_TABLET | Freq: Once | ORAL | Status: DC

## 2022-04-21 MED ORDER — POTASSIUM CHLORIDE CRYS ER 20 MEQ PO TBCR
20.0000 meq | EXTENDED_RELEASE_TABLET | ORAL | Status: AC
  Administered 2022-04-21: 20 meq via ORAL
  Filled 2022-04-21 (×2): qty 1

## 2022-04-21 NOTE — Consult Note (Signed)
Cardiology Consultation   Patient ID: Scott Carrillo MRN: [id number]; DOB: 1975-09-01  Admit date: 04/19/2022 Date of Consult: 04/21/2022  PCP:  Scott Carrillo, No   [place] Providers Cardiologist:  None        Patient Profile:   Scott Carrillo is a 46 y.o. male with a no significant PMH, admitted 04/19/22 for Rt upper chest pain and back pain along with cough and SOB who is being seen 04/21/2022 for the evaluation of atrial fib at the request of Dr. Tawanna Carrillo.  History of Present Illness:   Scott Carrillo with hx as above was admitted 04/19/22 with Rt upper chest pain and back pain along with cough and dyspnea.  He had been fatigued for 1 week.  + chest congestion but non productive cough. He came to ER due to diaphoresis and chills.  + tobacco use, no routine medications.   SARS-CoV-2 and influenza PCR negative.  WBC 23k   Venous duplex neg for DVT.   CTA chest/abdomen/pelvis showed masslike consolidation of the right upper and right middle lobes with early cavitation.  Involved area estimated to measure 9.1 x 10.1 x 5.4 cm.  Discrete 5 mm groundglass pulmonary nodule in the right lower lobe noted.  Mediastinal and right hilar adenopathy seen.  No evidence for aortic dissection or aneurysm.  Possible thrombus within the left IJ, subclavian vein, and SVC seen.  Bilateral renal cysts, normal appendix, small fat-containing bilateral inguinal hernias, and multiple thyroid nodules also reported  Diagnoses with sepsis with Multi lobar pneumococcal pneumonia with pneumococcal bacteremia.  On levofloxacin and metronidazole.    Pt was in SR with freq PACs then around 8 p yesterday went into a fib and by 2300 into a flutter rate at 170s went into atrial fib with RVR .  BP [id number], then systolic BP to [id number].  Placed on amiodarone drip.  K+ 3.2 and Mg+ 1.7 these were replaced. Pt converted to SR at 0306.   EKG:  The EKG was personally reviewed and demonstrates:  04/19/22 ST at 111 with RBBB on EKG 09/08/21 he  had incomplete RBBB.  04/20/22 2344 SVT possible atrial flutter rate 176  and RBBB  04/21/22 at 5:19 SR with RBBB and ST changes with RBBB HR 80.  Telemetry:  Telemetry was personally reviewed and demonstrates:  SR now   on tele pt went into a fib around 8 pm with rate in 90s prior to that time pt in SR with freq PACs.  Around 2300 HR up to 170s and regular appears to be flutter.    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    History reviewed. No pertinent past medical history.  Past Surgical History:  Procedure Laterality Date   WISDOM TOOTH EXTRACTION       Home Medications:  Prior to Admission medications   Medication Sig Start Date End Date Taking? Authorizing Provider  Menthol, Topical Analgesic, (ICY HOT ORIGINAL PAIN RELIEF EX) Apply 1 application  topically 2 (two) times daily as needed (pain). Icy Hot roll on   Yes [provider]  naproxen sodium (ALEVE) 220 MG tablet Take 660 mg  by mouth daily as needed (pain).   Yes [provider]  oxyCODONE-acetaminophen (PERCOCET) 5-325 MG tablet Take 1-2 tablets by mouth every 4 (four) hours as needed. Patient taking differently: Take 1 tablet by mouth daily as needed (pain). 09/09/21  Yes Pollina, Gwenyth Allegra, MD    Inpatient Medications: Scheduled Meds:  chlorpheniramine-HYDROcodone  5 mL Oral Q12H   enoxaparin (LOVENOX) injection  40 mg Subcutaneous Q24H   guaiFENesin  600 mg Oral BID   [START ON 04/22/2022] levofloxacin  750 mg Oral Daily   pantoprazole  40 mg Oral QAC lunch   sodium chloride flush  3 mL Intravenous Q12H   Continuous Infusions:  amiodarone 30 mg/hr (04/21/22 1452)   levofloxacin (LEVAQUIN) IV 750 mg (04/21/22 1457)   metronidazole 500 mg  (04/21/22 1309)   PRN Meds: acetaminophen **OR** acetaminophen, HYDROmorphone (DILAUDID) injection, ipratropium-albuterol, ondansetron **OR** ondansetron (ZOFRAN) IV, oxyCODONE, oxyCODONE, senna-docusate  Allergies:    Allergies  Allergen Reactions   Chocolate Anaphylaxis   Penicillins Anaphylaxis    Pt reported hives + throat swelling to PCN as child s/p wisdom teeth removal. No rxn noted per pt when received CTX in 2017.    Bee Venom Hives    Social History:   Social History   Socioeconomic History   Marital status: Single    Spouse name: Not on file   Number of children: Not on file   Years of education: Not on file   Highest education level: Not on file  Occupational History   Not on file  Tobacco Use   Smoking status: Every Day    Packs/day: 1.00    Types: Cigarettes   Smokeless tobacco: Never  Substance and Sexual Activity   Alcohol use: No   Drug use: Yes    Types: Marijuana    Comment: rare   Sexual activity: Not on file  Other Topics Concern   Not on file  Social History Narrative   Not on file   Social Determinants of Health   Financial Resource Strain: Not on file  Food Insecurity: No Food Insecurity (04/20/2022)   Hunger Vital Sign    Worried About Running Out of Food in the Last Year: Never true    Ran Out of Food in the Last Year: Never true  Transportation Needs: No Transportation Needs (04/20/2022)   PRAPARE - Hydrologist (Medical): No    Lack of Transportation (Non-Medical): No  Physical Activity: Not on file  Stress: Not on file  Social Connections: Not on file  Intimate Partner Violence: Not At Risk (04/20/2022)   Humiliation, Afraid, Rape, and Kick questionnaire    Fear of Current or Ex-Partner: No    Emotionally Abused: No    Physically Abused: No    Sexually Abused: No    Family History:   No family history on file. FH Mother is living no heart issues per pt,  father he does not know, 1 brother died as  infant and sister died at 26 he does not know why  ROS:  Please see the history of present illness.  General:no colds or fevers, no weight changes Skin:no rashes or ulcers HEENT:no blurred vision, no congestion CV:see HPI PUL:see HPI GI:no diarrhea constipation or melena, no indigestion GU:no hematuria, no dysuria MS:no joint pain, no claudication Neuro:no syncope, no lightheadedness Endo:no diabetes, no thyroid disease  All other ROS reviewed and negative.     Physical Exam/Data:   Vitals:   04/21/22 0145 04/21/22  0430 04/21/22 1148 04/21/22 1458  BP: 95/70 110/65 112/80   Pulse: (!) 110 84 83   Resp: (!) 24  19   Temp:  98.5 F (36.9 C) 98.5 F (36.9 C)   TempSrc:  Oral Oral   SpO2: 97% 98% 99% 98%  Weight:      Height:        Intake/Output Summary (Last 24 hours) at 04/21/2022 1542 Last data filed at 04/21/2022 1150 Gross per 24 hour  Intake 740 ml  Output 700 ml  Net 40 ml      04/19/2022   10:27 AM 09/08/2021    8:57 PM 03/20/2021   10:03 AM  Last 3 Weights  Weight (lbs) 173 lb 187 lb 6.3 oz 177 lb  Weight (kg) 78.472 kg 85 kg 80.287 kg     Body mass index is 29.7 kg/m.  General:  Well nourished, well developed, in no acute distress though weak and not feeling well. HEENT: normal Neck: no JVD Vascular: No carotid bruits; Distal pulses 3+ bilaterally Cardiac:  normal S1, S2; RRR; no murmur gallup rub or click Lungs:  clear to auscultation bilaterally, no wheezing, rhonchi or rales ant.  Abd: soft, +tenderness Lt upper quad, tender diffuse, no hepatomegaly  Ext: no edema Musculoskeletal:  No deformities, BUE and BLE strength normal and equal Skin: warm and dry  Neuro:  alert and oriented X 3 MAE follows commands, no focal abnormalities noted Psych:  Normal affect    Relevant CV Studies: Echo pending   Laboratory Data:  High Sensitivity Troponin:   Recent Labs  Lab 04/19/22 1035 04/19/22 1235 04/21/22 0210  TROPONINIHS 11 9 21*      Chemistry Recent Labs  Lab 04/19/22 1035 04/20/22 0247 04/21/22 0210  NA 135 137 135  K 3.5 3.8 3.2*  CL 106 110 105  CO2 20* 21* 21*  GLUCOSE 138* 110* 134*  BUN 15 12 11   CREATININE 0.94 0.95 0.98  CALCIUM 8.8* 8.7* 8.7*  MG  --   --  1.7  GFRNONAA >60 >60 >60  ANIONGAP 9 6 9     Recent Labs  Lab 04/19/22 1035  PROT 6.5  ALBUMIN 3.0*  AST 30  ALT 25  ALKPHOS 84  BILITOT 0.6   Lipids No results for input(s): "CHOL", "TRIG", "HDL", "LABVLDL", "LDLCALC", "CHOLHDL" in the last 168 hours.  Hematology Recent Labs  Lab 04/19/22 1035 04/20/22 0247 04/21/22 0210  WBC 23.1* 15.3* 7.3  RBC 4.46 3.77* 4.20*  HGB 13.6 11.6* 13.1  HCT 39.5 33.7* 37.5*  MCV 88.6 89.4 89.3  MCH 30.5 30.8 31.2  MCHC 34.4 34.4 34.9  RDW 13.6 13.8 14.0  PLT 200 175 214   Thyroid  Recent Labs  Lab 04/21/22 0210  TSH 3.039    BNP Recent Labs  Lab 04/19/22 1035  BNP 165.3*    DDimer No results for input(s): "DDIMER" in the last 168 hours.   Radiology/Studies:  US Venous Img Upper Left (DVT Study)  Result Date: 04/19/2022 CLINICAL DATA:  Possible thrombus seen on recent CT imaging EXAM: LEFT UPPER EXTREMITY VENOUS DOPPLER ULTRASOUND TECHNIQUE: Gray-scale sonography with graded compression, as well as color Doppler and duplex ultrasound were performed to evaluate the upper extremity deep venous system from the level of the subclavian vein and including the jugular, axillary, basilic, radial, ulnar and upper cephalic vein. Spectral Doppler was utilized to evaluate flow at rest and with distal augmentation maneuvers. COMPARISON:  None Available. FINDINGS: Contralateral Subclavian Vein: Respiratory  phasicity is normal and symmetric with the symptomatic side. No evidence of thrombus. Normal compressibility. Internal Jugular Vein: No evidence of thrombus. Normal compressibility, respiratory phasicity and response to augmentation. Subclavian Vein: No evidence of thrombus. Normal compressibility,  respiratory phasicity and response to augmentation. Axillary Vein: No evidence of thrombus. Normal compressibility, respiratory phasicity and response to augmentation. Cephalic Vein: No evidence of thrombus. Normal compressibility, respiratory phasicity and response to augmentation. Basilic Vein: No evidence of thrombus. Normal compressibility, respiratory phasicity and response to augmentation. Brachial Veins: No evidence of thrombus. Normal compressibility, respiratory phasicity and response to augmentation. Radial Veins: No evidence of thrombus. Normal compressibility, respiratory phasicity and response to augmentation. Ulnar Veins: No evidence of thrombus. Normal compressibility, respiratory phasicity and response to augmentation. Venous Reflux:  None visualized. Other Findings:  None visualized. IMPRESSION: No evidence of DVT within the left upper extremity. Electronically Signed   By: Jacqulynn Cadet M.D.   On: 04/19/2022 14:50   CT Angio Chest/Abd/Pel for Dissection W and/or W/WO  Result Date: 04/19/2022 CLINICAL DATA:  Acute aortic syndrome suspected. Chest pain, back pain, cough. Nausea, headache, shortness of breath. The EXAM: CT ANGIOGRAPHY CHEST, ABDOMEN AND PELVIS TECHNIQUE: Non-contrast CT of the chest was initially obtained. Multidetector CT imaging through the chest, abdomen and pelvis was performed using the standard protocol during bolus administration of intravenous contrast. Multiplanar reconstructed images and MIPs were obtained and reviewed to evaluate the vascular anatomy. RADIATION DOSE REDUCTION: This exam was performed according to the departmental dose-optimization program which includes automated exposure control, adjustment of the mA and/or kV according to patient size and/or use of iterative reconstruction technique. CONTRAST:  124mL OMNIPAQUE IOHEXOL 350 MG/ML SOLN COMPARISON:  Chest x-ray on 04/19/2022 FINDINGS: CTA CHEST FINDINGS Cardiovascular: Heart is normal in size. No  pericardial effusion. No evidence for aortic dissection or aneurysm. The pulmonary arteries are normal in appearance accounting for contrast bolus timing. Multiple collaterals are identified in the LEFT shoulder region. There is possible thrombus within the LOWER LEFT internal jugular vein (6:1). Possible thrombus versus mixing of opacified and non-opacified blood within the LEFT subclavian/superior vena cava. Recommend further evaluation with LEFT UPPER extremity venous doppler. Mediastinum/Nodes: Multiple thyroid nodules are identified. Within the isthmus, the largest is 1.7 centimeters (6:10). Additional thyroid nodules are identified in the LEFT lobe. Mediastinal adenopathy noted, largest RIGHT paratracheal lymph node measures 1.2 centimeters. There is confluent opacity in the RIGHT hilar region, precluding evaluation for discrete lymph nodes. However, adenopathy in this region is suspected. No significant LEFT hilar adenopathy. Lungs/Pleura: There is masslike consolidation of the RIGHT UPPER lobe and RIGHT middle lobe, with early cavitation noted in these regions. The involved area is estimated to measure 9.1 x 10.1 x 5.4 centimeters. Consolidation and/or atelectasis present within the RIGHT LOWER lobe. Ground-glass pulmonary nodule in the RIGHT LOWER lobe is 5 millimeters on image 7:26. LEFT lung is clear. No pleural effusions. Musculoskeletal: No chest wall abnormality. No acute or significant osseous findings. Review of the MIP images confirms the above findings. CTA ABDOMEN AND PELVIS FINDINGS VASCULAR Aorta: Normal caliber aorta without aneurysm, dissection, vasculitis or significant stenosis. Celiac: Patent without evidence of aneurysm, dissection, vasculitis or significant stenosis. SMA: Patent without evidence of aneurysm, dissection, vasculitis or significant stenosis. Renals: Both renal arteries are patent without evidence of aneurysm, dissection, vasculitis, fibromuscular dysplasia or significant  stenosis. IMA: Patent without evidence of aneurysm, dissection, vasculitis or significant stenosis. Inflow: Patent without evidence of aneurysm, dissection, vasculitis or significant stenosis. Veins: No obvious  venous abnormality within the limitations of this arterial phase study. Review of the MIP images confirms the above findings. NON-VASCULAR Hepatobiliary: No focal liver abnormality is seen. No gallstones, gallbladder wall thickening, or biliary dilatation. Pancreas: Unremarkable. No pancreatic ductal dilatation or surrounding inflammatory changes. Spleen: Normal in size without focal abnormality. Adrenals/Urinary Tract: Normal adrenal glands. Bilateral renal cysts, largest in the RIGHT kidney measuring 2.1 centimeters. No further imaging is recommended. No hydronephrosis. Ureters are unremarkable. The bladder and visualized portion of the urethra are normal. Stomach and small bowel loops are normal in appearance. The appendix is well seen and normal in appearance. Colon is unremarkable. Lymphatic: Unremarkable. Reproductive: Prostatic calcifications noted. Other: No ascites.  Small fat containing bilateral inguinal hernias. Musculoskeletal: No acute or significant osseous findings. Review of the MIP images confirms the above findings. IMPRESSION: 1. Masslike consolidation of the RIGHT UPPER lobe and RIGHT middle lobe, with early cavitation present. The involved area is estimated to measure 9.1 x 10.1 x 5.4 centimeters. Although the findings are suspicious for malignancy, the rapid change since prior chest x-ray performed on 09/08/2021 suggest infectious/inflammatory causes. Necrotizing infection should be excluded. 2. Discrete 5 millimeter ground-glass pulmonary nodule in the RIGHT LOWER lobe. 3. Mediastinal and RIGHT hilar adenopathy. 4. No evidence for aortic dissection or aneurysm. 5. Possible thrombus within the LEFT internal jugular, subclavian vein, and superior vena cava. Recommend further evaluation  with LEFT UPPER extremity venous Doppler exam. 6. Bilateral renal cysts. No follow-up imaging is recommended. JACR 2018 Feb; 264-273, Management of the Incidental RenalMass on CT, Radio[employer] 2021; 814-848, Bosniak Classification of Cystic Renal Masses, Version 2019. 7. Normal appendix. 8. Small fat containing bilateral inguinal hernias. 9. Multiple thyroid nodules, the largest measuring 1.7 centimeters. Recommend non-emergent thyroid ultrasound. Reference: J Am Coll Radiol. 2015 Feb;12(2): 143-50 Electronically Signed   By: Nolon Nations M.D.   On: 04/19/2022 13:42   DG Chest Portable 1 View  Result Date: 04/19/2022 CLINICAL DATA:  Shortness of breath with cough and headache. EXAM: PORTABLE CHEST 1 VIEW COMPARISON:  Chest radiograph 09/07/2018 FINDINGS: The cardiomediastinal silhouette is normal. There is confluent opacity projecting over the right lower lobe. The right upper lobe is well-aerated. The left lung is clear. There is a small right pleural effusion. There is no significant left effusion. There is no pneumothorax There is no acute osseous abnormality. IMPRESSION: Confluent right lower lobe opacity raising concern for pneumonia with small parapneumonic effusion. Recommend follow-up radiographs in 3-4 weeks to ensure resolution. Electronically Signed   By: Valetta Mole M.D.   On: 04/19/2022 11:13     Assessment and Plan:   Atrial flutter with RVR given IV amiodarone and replaced k+ and Mg+ and now in SR.  TSH is normal.  Most likely related to sepsis.  CHA2DS2Vasc score of 0.  If echo normal would stop amiodarone and no anticoagulation.  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  Otherwise normal EKG. Sepsis with multilobar PNA (RUL/RML cavitary strep pneumonia and Hilar/right adenopathy)  per IM and pulmonary  Tobacco use he has rec'd smoking cessation counseling.   Risk  Assessment/Risk Scores:          CHA2DS2-VASc Score = 0   This indicates a 0.2% annual risk of stroke. The patient's score is based upon: CHF History: 0 HTN History: 0 Diabetes History: 0 Stroke History: 0 Vascular Disease  History: 0 Age Score: 0 Gender Score: 0          For questions or updates, please contact [place] Please consult [email or web address] for contact info under    Signed, Cecilie Kicks, NP  04/21/2022 3:42 PM

## 2022-04-21 NOTE — Progress Notes (Signed)
Amiodarone Drug - Drug Interaction Consult Note  Recommendations: No current interactions.  Amiodarone is metabolized by the cytochrome P450 system and therefore has the potential to cause many drug interactions. Amiodarone has an average plasma half-life of 50 days (range 20 to 100 days).   There is potential for drug interactions to occur several weeks or months after stopping treatment and the onset of drug interactions may be slow after initiating amiodarone.   []  Statins: Increased risk of myopathy. Simvastatin- restrict dose to 20mg  daily. Other statins: counsel patients to report any muscle pain or weakness immediately.  []  Anticoagulants: Amiodarone can increase anticoagulant effect. Consider warfarin dose reduction. Patients should be monitored closely and the dose of anticoagulant altered accordingly, remembering that amiodarone levels take several weeks to stabilize.  []  Antiepileptics: Amiodarone can increase plasma concentration of phenytoin, the dose should be reduced. Note that small changes in phenytoin dose can result in large changes in levels. Monitor patient and counsel on signs of toxicity.  []  Beta blockers: increased risk of bradycardia, AV block and myocardial depression. Sotalol - avoid concomitant use.  []   Calcium channel blockers (diltiazem and verapamil): increased risk of bradycardia, AV block and myocardial depression.  []   Cyclosporine: Amiodarone increases levels of cyclosporine. Reduced dose of cyclosporine is recommended.  []  Digoxin dose should be halved when amiodarone is started.  []  Diuretics: increased risk of cardiotoxicity if hypokalemia occurs.  []  Oral hypoglycemic agents (glyburide, glipizide, glimepiride): increased risk of hypoglycemia. Patient's glucose levels should be monitored closely when initiating amiodarone therapy.   [x]  Drugs that prolong the QT interval:  Torsades de pointes risk may be increased with concurrent use - avoid if  possible.  Monitor QTc, also keep magnesium/potassium WNL if concurrent therapy can't be avoided. On levofloxacin - EKG 5 hrs after amiodarone infusion started with QTC ~420 per hand calculation. K 3.2, Mg 1.7 both repleted. Continue to monitor QTC and keep K >4, Mg >2 while on levofloxacin  + amiodarone   Antibiotics: e.g. fluoroquinolones, erythromycin.  Antiarrhythmics: e.g. quinidine, procainamide, disopyramide, sotalol.  Antipsychotics: e.g. phenothiazines, haloperidol.   Lithium, tricyclic antidepressants, and methadone.  Benetta Spar, PharmD, BCPS, BCCP Clinical Pharmacist  Please check AMION for all [place] phone numbers After 10:00 PM, call [place] [phone]

## 2022-04-21 NOTE — Progress Notes (Signed)
PROGRESS NOTE  Scott Carrillo  XKG:[id number] DOB: May 02, 1976 DOA: 04/19/2022 PCP: Pcp, No   Brief Narrative:  Patient is a 46 year old male who is a smoker presented with complaint of chest pain, back pain, cough, congestion.  On presentation, he was mildly febrile, tachypneic.  Labs showed WBC count of 23.1, lactic acid of 2.3.  CTA chest showed right upper lobe, right middle lobe masslike consolidation with early cavitation, possible thrombus in the left IJ, left subclavian, SVC.  Also showed 5 mm lung nodule in right lower lobe, mediastinal, right hilar adenopathy. Strept pneumonia urine antigen was positive.  One of the blood cultures also showed Streptococcus pneumoniae.  Started on antibiotics.  PCCM  consulted.  Hospital course also remarkable for new onset A-fib, started on amiodarone drip, cardiology consulted  Assessment & Plan:  Principal Problem:   Sepsis due to right upper and middle lobe cavitary pneumonia ([place]) Active Problems:   Lung nodule   Multiple thyroid nodules   Tobacco use   Pneumonia of right middle lobe due to infectious organism   Inguinal hernia, bilateral   Renal cyst   Sepsis secondary to right-sided pneumonia: Presented with fever, tachypnea, leukocytosis, lactic acidosis, elevated procalcitonin.CTA chest showed right upper lobe, right middle lobe masslike consolidation with early cavitation.  PCCM also following.  Also has right-sided pleuritic chest pain.  Continue pain management.  Continue cough medication, incentive spirometer. Also showed 5 mm lung nodule in the right lower lobe, mediastinal, right hilar adenopathy.  PCCM recommending follow-up as an outpatient in 4 to 6 weeks for consideration of bronchoscopy. Continue Levaquin and flagyl.  We will consider discontinue Flagyl.  Continue DuoNeb for wheezing. Currently on 2 L of oxygen per minute.  Not on oxygen at home.  We will continue to wean. Leukocytosis resolved.  Positive blood culture: One of  the blood cultures came out to be positive for Strept  pneumoniae.  We will repeat the culture, continue current antibiotic.  Most likely contaminant  Tobacco use: Smoking cessation provided.  GERD: Continue Protonix  Bilateral inguinal hernia containing fat: Small and asymptomatic.  Hypokalemia: Supplemented with potassium  Paroxysmal A-fib: Noted to be in A-fib this morning and was started on amiodarone drip.  No history of A-fib in the past.  Likely transient.  CHADVASc  of 0.  Echo ordered.  Cardiology consulted    DVT prophylaxis:enoxaparin (LOVENOX) injection 40 mg Start: 04/20/22 1400     Code Status: Full Code  Family Communication: Called wife for update on 10/4  Patient status:Inpatient  Patient is from :Home  Anticipated discharge FW:YOVZ  Estimated DC date:1-2 days   Consultants: PCCM  Procedures:None  Antimicrobials:  Anti-infectives (From admission, onward)    Start     Dose/Rate Route Frequency Ordered Stop   04/22/22 1000  levofloxacin (LEVAQUIN) tablet 750 mg        750 mg Oral Daily 04/21/22 1107     04/21/22 1130  levofloxacin (LEVAQUIN) IVPB 750 mg        750 mg 100 mL/hr over 90 Minutes Intravenous Every 24 hours 04/21/22 1107 04/22/22 1159   04/20/22 1200  levofloxacin (LEVAQUIN) IVPB 750 mg  Status:  Discontinued        750 mg 100 mL/hr over 90 Minutes Intravenous Every 24 hours 04/19/22 2307 04/21/22 1107   04/20/22 0000  metroNIDAZOLE (FLAGYL) IVPB 500 mg        500 mg 100 mL/hr over 60 Minutes Intravenous Every 12 hours 04/19/22 2307  04/19/22 1145  levofloxacin (LEVAQUIN) IVPB 750 mg        750 mg 100 mL/hr over 90 Minutes Intravenous  Once 04/19/22 1142 04/19/22 1440       Subjective: Patient seen and examined at bedside today.  Heart rate is well controlled, currently is in normal sinus rhythm .  Complains of right-sided chest pain.  No weakness, cough  Objective: Vitals:   04/21/22 0130 04/21/22 0145 04/21/22 0430 04/21/22  1148  BP: 93/74 95/70 110/65 112/80  Pulse: 87 (!) 110 84 83  Resp: 17 (!) 24  19  Temp:   98.5 F (36.9 C) 98.5 F (36.9 C)  TempSrc:   Oral Oral  SpO2: 98% 97% 98% 99%  Weight:      Height:        Intake/Output Summary (Last 24 hours) at 04/21/2022 1326 Last data filed at 04/21/2022 1150 Gross per 24 hour  Intake 740 ml  Output 700 ml  Net 40 ml   Filed Weights   04/19/22 1027  Weight: 78.5 kg    Examination:  General exam: doesnot feel well, not in distress HEENT: PERRL Respiratory system: Diminished sounds on the right side, crackles on the right side Cardiovascular system: S1 & S2 heard, RRR.  Gastrointestinal system: Abdomen is nondistended, soft and nontender. Central nervous system: Alert and oriented Extremities: No edema, no clubbing ,no cyanosis Skin: No rashes, no ulcers,no icterus     Data Reviewed: I have personally reviewed following labs and imaging studies  CBC: Recent Labs  Lab 04/19/22 1035 04/20/22 0247 04/21/22 0210  WBC 23.1* 15.3* 7.3  HGB 13.6 11.6* 13.1  HCT 39.5 33.7* 37.5*  MCV 88.6 89.4 89.3  PLT 200 175 [id number]   Basic Metabolic Panel: Recent Labs  Lab 04/19/22 1035 04/20/22 0247 04/21/22 0210  NA 135 137 135  K 3.5 3.8 3.2*  CL 106 110 105  CO2 20* 21* 21*  GLUCOSE 138* 110* 134*  BUN 15 12 11   CREATININE 0.94 0.95 0.98  CALCIUM 8.8* 8.7* 8.7*  MG  --   --  1.7     Recent Results (from the past 240 hour(s))  Resp Panel by RT-PCR (Flu A&B, Covid) Anterior Nasal Swab     Status: None   Collection Time: 04/19/22 10:36 AM   Specimen: Anterior Nasal Swab  Result Value Ref Range Status   SARS Coronavirus 2 by RT PCR NEGATIVE NEGATIVE Final    Comment: (NOTE) SARS-CoV-2 target nucleic acids are NOT DETECTED.  The SARS-CoV-2 RNA is generally detectable in upper respiratory specimens during the acute phase of infection. The lowest concentration of SARS-CoV-2 viral copies this assay can detect is 138 copies/mL. A negative  result does not preclude SARS-Cov-2 infection and should not be used as the sole basis for treatment or other patient management decisions. A negative result may occur with  improper specimen collection/handling, submission of specimen other than nasopharyngeal swab, presence of viral mutation(s) within the areas targeted by this assay, and inadequate number of viral copies(<138 copies/mL). A negative result must be combined with clinical observations, patient history, and epidemiological information. The expected result is Negative.  Fact Sheet for Patients:  EntrepreneurPulse.com.au  Fact Sheet for Healthcare Providers:  IncredibleEmployment.be  This test is no t yet approved or cleared by the [place] FDA and  has been authorized for detection and/or diagnosis of SARS-CoV-2 by FDA under an Emergency Use Authorization (EUA). This EUA will remain  in effect (meaning this test can  be used) for the duration of the COVID-19 declaration under Section 564(b)(1) of the Act, 21 U.S.C.section 360bbb-3(b)(1), unless the authorization is terminated  or revoked sooner.       Influenza A by PCR NEGATIVE NEGATIVE Final   Influenza B by PCR NEGATIVE NEGATIVE Final    Comment: (NOTE) The Xpert Xpress SARS-CoV-2/FLU/RSV plus assay is intended as an aid in the diagnosis of influenza from Nasopharyngeal swab specimens and should not be used as a sole basis for treatment. Nasal washings and aspirates are unacceptable for Xpert Xpress SARS-CoV-2/FLU/RSV testing.  Fact Sheet for Patients: EntrepreneurPulse.com.au  Fact Sheet for Healthcare Providers: IncredibleEmployment.be  This test is not yet approved or cleared by the [place] FDA and has been authorized for detection and/or diagnosis of SARS-CoV-2 by FDA under an Emergency Use Authorization (EUA). This EUA will remain in effect (meaning this test can be used)  for the duration of the COVID-19 declaration under Section 564(b)(1) of the Act, 21 U.S.C. section 360bbb-3(b)(1), unless the authorization is terminated or revoked.  Performed at [facility], [address] [place], [place] [address]   Culture, blood (routine x 2)     Status: Abnormal (Preliminary result)   Collection Time: 04/19/22 10:55 AM   Specimen: BLOOD  Result Value Ref Range Status   Specimen Description   Final    BLOOD LEFT ANTECUBITAL Performed at [facility], [address] [place], [place] [address]    Special Requests   Final    BOTTLES DRAWN AEROBIC AND ANAEROBIC Blood Culture adequate volume Performed at [facility], [address] [place], [place] [address]    Culture  Setup Time   Final    GRAM POSITIVE COCCI IN PAIRS ANAEROBIC BOTTLE ONLY CRITICAL RESULT CALLED TO, READ BACK BY AND VERIFIED WITH: T RUDISILL,PHARMD@[id number]  04/20/22 [place] [facility] [place]    Culture (A)  Final    STREPTOCOCCUS PNEUMONIAE SUSCEPTIBILITIES TO FOLLOW Performed at [facility], [place] [address] [place], [place] [address]    Report Status PENDING  Incomplete  Blood Culture ID Panel (Reflexed)     Status: Abnormal   Collection Time: 04/19/22 10:55 AM  Result Value Ref Range Status   Enterococcus faecalis NOT DETECTED NOT DETECTED Final   Enterococcus Faecium NOT DETECTED NOT DETECTED Final   Listeria monocytogenes NOT DETECTED NOT DETECTED Final   Staphylococcus species NOT DETECTED NOT DETECTED Final   Staphylococcus aureus (BCID) NOT DETECTED NOT DETECTED Final   Staphylococcus epidermidis NOT DETECTED NOT DETECTED Final   Staphylococcus lugdunensis NOT DETECTED NOT DETECTED Final   Streptococcus species DETECTED (A) NOT DETECTED Final    Comment: CRITICAL RESULT CALLED TO, READ BACK BY AND VERIFIED WITH: T RUDISILL,PHARMD@0537  04/20/22 [place]    Streptococcus agalactiae NOT DETECTED NOT DETECTED Final   Streptococcus pneumoniae DETECTED (A) NOT DETECTED  Final    Comment: CRITICAL RESULT CALLED TO, READ BACK BY AND VERIFIED WITH: T RUDISILL,PHARMD@0537  04/20/22 [place]    Streptococcus pyogenes NOT DETECTED NOT DETECTED Final   A.calcoaceticus-baumannii NOT DETECTED NOT DETECTED Final   Bacteroides fragilis NOT DETECTED NOT DETECTED Final   Enterobacterales NOT DETECTED NOT DETECTED Final   Enterobacter cloacae complex NOT DETECTED NOT DETECTED Final   Escherichia coli NOT DETECTED NOT DETECTED Final   Klebsiella aerogenes NOT DETECTED NOT DETECTED Final   Klebsiella oxytoca NOT DETECTED NOT DETECTED Final   Klebsiella pneumoniae NOT DETECTED NOT DETECTED Final   Proteus species NOT DETECTED NOT DETECTED  Final   Salmonella species NOT DETECTED NOT DETECTED Final   Serratia marcescens NOT DETECTED NOT DETECTED Final   Haemophilus influenzae NOT DETECTED NOT DETECTED Final   Neisseria meningitidis NOT DETECTED NOT DETECTED Final   Pseudomonas aeruginosa NOT DETECTED NOT DETECTED Final   Stenotrophomonas maltophilia NOT DETECTED NOT DETECTED Final   Candida albicans NOT DETECTED NOT DETECTED Final   Candida auris NOT DETECTED NOT DETECTED Final   Candida glabrata NOT DETECTED NOT DETECTED Final   Candida krusei NOT DETECTED NOT DETECTED Final   Candida parapsilosis NOT DETECTED NOT DETECTED Final   Candida tropicalis NOT DETECTED NOT DETECTED Final   Cryptococcus neoformans/gattii NOT DETECTED NOT DETECTED Final    Comment: Performed at [facility], [address], [place], [place] [address]  Culture, blood (routine x 2)     Status: None (Preliminary result)   Collection Time: 04/19/22 10:57 AM   Specimen: BLOOD  Result Value Ref Range Status   Specimen Description   Final    BLOOD RIGHT ANTECUBITAL Performed at [facility], [address] [place], [place] [address]    Special Requests   Final    BOTTLES DRAWN AEROBIC AND ANAEROBIC Blood Culture adequate volume Performed at [facility], [address] [place], [place] [address]    Culture   Final    NO GROWTH 2 DAYS Performed at [facility], [place] [address], [place], [place] [address]    Report Status PENDING  Incomplete     Radiology Studies: US Venous Img Upper Left (DVT Study)  Result Date: 04/19/2022 CLINICAL DATA:  Possible thrombus seen on recent CT imaging EXAM: LEFT UPPER EXTREMITY VENOUS DOPPLER ULTRASOUND TECHNIQUE: Gray-scale sonography with graded compression, as well as color Doppler and duplex ultrasound were performed to evaluate the upper extremity deep venous system from the level of the subclavian vein and including the jugular, axillary, basilic, radial, ulnar and upper cephalic vein. Spectral Doppler was utilized to evaluate flow at rest and with distal augmentation maneuvers. COMPARISON:  None Available. FINDINGS: Contralateral Subclavian Vein: Respiratory phasicity is normal and symmetric with the symptomatic side. No evidence of thrombus. Normal compressibility. Internal Jugular Vein: No evidence of thrombus. Normal compressibility, respiratory phasicity and response to augmentation. Subclavian Vein: No evidence of thrombus. Normal compressibility, respiratory phasicity and response to augmentation. Axillary Vein: No evidence of thrombus. Normal compressibility, respiratory phasicity and response to augmentation. Cephalic Vein: No evidence of thrombus. Normal compressibility, respiratory phasicity and response to augmentation. Basilic Vein: No evidence of thrombus. Normal compressibility, respiratory phasicity and response to augmentation. Brachial Veins: No evidence of thrombus. Normal compressibility, respiratory phasicity and response to augmentation. Radial Veins: No evidence of thrombus. Normal compressibility, respiratory phasicity and response to augmentation. Ulnar Veins: No evidence of thrombus. Normal compressibility, respiratory phasicity and response to augmentation. Venous Reflux:  None visualized. Other  Findings:  None visualized. IMPRESSION: No evidence of DVT within the left upper extremity. Electronically Signed   By: Jacqulynn Cadet M.D.   On: 04/19/2022 14:50    Scheduled Meds:  chlorpheniramine-HYDROcodone  5 mL Oral Q12H   enoxaparin (LOVENOX) injection  40 mg Subcutaneous Q24H   guaiFENesin  600 mg Oral BID   [START ON 04/22/2022] levofloxacin  750 mg Oral Daily   pantoprazole  40 mg Oral QAC lunch   sodium chloride flush  3 mL Intravenous Q12H   Continuous Infusions:  amiodarone 30 mg/hr (04/21/22 [id number])   levofloxacin (LEVAQUIN)  IV     metronidazole 500 mg (04/21/22 1309)     LOS: 2 days   Shelly Coss, MD [facility] P10/10/2021, 1:26 PM

## 2022-04-21 NOTE — Progress Notes (Signed)
Notified by central monitoring unit that patient was in A-fib with a HR in 130's - 140's.  Patient was assessed and vitals were obtained. Patient only complaint at that time was pain 6 out of 10 in R rib area.  Clarene Essex NP was notified and patient was given Oxycodone for pain.  Monitored patient's HR for next couple of hours but still running high and into the 140's - 150's.  Paged J. Olena Heckle again and was instructed to call E Link.  E link physician ordered labs and EKG.  Patient was started on an Amiodarone drip around 0030 and vitals were taken Q15 minutes during bolus.  Central Monitoring Unit Notified nursing that patient converted to Normal Sinus Rhythm around 0306.  Patient continues to get Amiodarone drip that has been titrated per pharmacy orders.  Also receiving replacement Potassium and Magnesium at this time. A second EKG was obtained and copy is in physical chart.  Patient has been medicated for pain and is resting comfortably with significant other at bedside.

## 2022-04-21 NOTE — Progress Notes (Signed)
eLink Physician-Brief Progress Note Patient Name: Scott Carrillo DOB: April 29, 1976 MRN: [id number]   Date of Service  04/21/2022  HPI/Events of Note  Patient went into atrial fibrillation with RVR, rate 130 - 160, BP initially 113/75, MAP 85, but SBP now down to 105, QTC 480, Amiodarone protocol ordered (bolus + gtt), BMP, Mg++ , Troponin ordered.  eICU Interventions  See above.        Zayley Arras U Andretta Ergle 04/21/2022, 12:09 AM

## 2022-04-21 NOTE — Progress Notes (Signed)
[place] Progress Note Patient Name: Scott Carrillo DOB: Apr 14, 1976 MRN: [id number]   Date of Service  04/21/2022  HPI/Events of Note  K+ 3.2, Mg++ 1.7  eICU Interventions  Electrolytes replaced per E-Link electrolyte replacement protocol.        Kerry Kass Robert Sunga 04/21/2022, 4:16 AM

## 2022-04-21 NOTE — Progress Notes (Signed)
NAME:  Scott Carrillo, MRN:  [id number], DOB:  Dec 25, 1975, LOS: 2 ADMISSION DATE:  04/19/2022, CONSULTATION DATE:  10/3 REFERRING MD:  Dr. Allena Katz, CHIEF COMPLAINT:  Strep Pneumonia   History of Present Illness:  Patient is a 46 year old male with no PMH on file presents to [facility] ED on 10/2 with right-sided chest pain and SOB.  Patient states he has been having fatigue for about a week.  Developed right-sided chest pain/back pain a few days ago prior to admission.  Patient states he has been having some SOB and nonproductive cough.  On 10/2 patient began having chills and diaphoresis.  Patient came to [facility] ED for further work-up.  Patient states he does smoke a pack a day for 30 years. Denies weight loss, fever, night sweats. Patient states he had a concerning pulmonary nodule about 10 years ago seen on CT chest. Patient states that they repeated imaging and told him it was in remission and he quit following up after that.  Upon arrival to [facility] ED on 10/2, vital stable.  On room air with sats 95%.  COVID/flu negative.  CXR showing RLL opacity.  CTA chest/abdomen/pelvis showing masslike consolidation of RUL/RML with early cavitation; suspicious for malignancy; necrotizing infection should be excluded; pulmonary nodule 5 mm on RLL; mediastinal/right hilar adenopathy.  WBC 23.  Urine strep pneumonia positive.  Blood cultures showing strep pneumonia.  Patient started on Levaquin and Flagyl.  PCCM consulted.   Legionella and expectorated sputum pending.  Pertinent  Medical History  Tobacco abuse   Significant Hospital Events: Including procedures, antibiotic start and stop dates in addition to other pertinent events   10/2 admitted with strep pneumonia; concern for possible malignancy on CT chest; PCCM consulted  Interim History / Subjective:  On 2L [place]; sats 100% Sleeping in bed Denies any trouble breathing  Objective   Blood pressure 110/65, pulse 84, temperature 98.5 F (36.9 C), temperature  source Oral, resp. rate (!) 24, height 5\' 4"  (1.626 m), weight 78.5 kg, SpO2 98 %.        Intake/Output Summary (Last 24 hours) at 04/21/2022 1049 Last data filed at 04/21/2022 0646 Gross per 24 hour  Intake 977 ml  Output 700 ml  Net 277 ml    Filed Weights   04/19/22 1027  Weight: 78.5 kg    Examination: General:   NAD HEENT: MM pink/moist; [place] in place Neuro: Aox3; MAE CV: s1s2, RRR, no m/r/g PULM:  crackles BS on [place] side; Left lung dim clear bs; [place] 2L GI: soft, bsx4 active  Extremities: warm/dry, no edema  Skin: no rashes or lesions appreciated   Resolved Hospital Problem list     Assessment & Plan:  RUL/RML cavitary strep pneumonia 5 mm RLL pulmonary nodule Hilar/right adenopathy -CTA chest 10/2 showing masslike consolidation of RUL/RML with early cavitation; suspicious for malignancy; necrotizing infection should be excluded; pulmonary nodule 5 mm on RLL; mediastinal/right hilar adenopathy.  P: -wean [place] for sats >92% -continue levaquin; consider stopping flagyl -f/u legionella and expectorated sputum -Pulm toiletry: IS/Flutter -prn duoneb for wheezing -Patient is to f/u with [facility]. 12/2 4-6 weeks after discharge for consideration of bronchoscopy  Tobacco abuse P: -smoking cessation counseling given   Best Practice (right click and "Reselect all SmartList Selections" daily)   Per primary  Labs   CBC: Recent Labs  Lab 04/19/22 1035 04/20/22 0247 04/21/22 0210  WBC 23.1* 15.3* 7.3  HGB 13.6 11.6* 13.1  HCT 39.5 33.7* 37.5*  MCV 88.6  89.4 89.3  PLT 200 175 214     Basic Metabolic Panel: Recent Labs  Lab 04/19/22 1035 04/20/22 0247 04/21/22 0210  NA 135 137 135  K 3.5 3.8 3.2*  CL 106 110 105  CO2 20* 21* 21*  GLUCOSE 138* 110* 134*  BUN 15 12 11   CREATININE 0.94 0.95 0.98  CALCIUM 8.8* 8.7* 8.7*  MG  --   --  1.7    GFR: Estimated Creatinine Clearance: 91 mL/min (by C-G formula based on SCr of 0.98 mg/dL). Recent  Labs  Lab 04/19/22 1035 04/19/22 1036 04/19/22 1235 04/20/22 0247 04/21/22 0210  PROCALCITON  --   --   --  1.96  --   WBC 23.1*  --   --  15.3* 7.3  LATICACIDVEN  --  2.3* 1.5  --   --      Liver Function Tests: Recent Labs  Lab 04/19/22 1035  AST 30  ALT 25  ALKPHOS 84  BILITOT 0.6  PROT 6.5  ALBUMIN 3.0*    Recent Labs  Lab 04/19/22 1035  LIPASE 32    No results for input(s): "AMMONIA" in the last 168 hours.  ABG No results found for: "PHART", "PCO2ART", "PO2ART", "HCO3", "TCO2", "ACIDBASEDEF", "O2SAT"   Coagulation Profile: No results for input(s): "INR", "PROTIME" in the last 168 hours.  Cardiac Enzymes: No results for input(s): "CKTOTAL", "CKMB", "CKMBINDEX", "TROPONINI" in the last 168 hours.  HbA1C: No results found for: "HGBA1C"  CBG: No results for input(s): "GLUCAP" in the last 168 hours.  Review of Systems:      Past Medical History:  He,  has no past medical history on file.   Surgical History:   Past Surgical History:  Procedure Laterality Date   WISDOM TOOTH EXTRACTION       Social History:   reports that he has been smoking. He has been smoking an average of 1 pack per day. He has never used smokeless tobacco. He reports current drug use. Drug: Marijuana. He reports that he does not drink alcohol.   Family History:  His family history is not on file.   Allergies Allergies  Allergen Reactions   Chocolate Anaphylaxis   Penicillins Anaphylaxis    Pt reported hives + throat swelling to PCN as child s/p wisdom teeth removal. No rxn noted per pt when received CTX in 2017.    Bee Venom Hives     Home Medications  Prior to Admission medications   Medication Sig Start Date End Date Taking? Authorizing Provider  Menthol, Topical Analgesic, (ICY HOT ORIGINAL PAIN RELIEF EX) Apply 1 application  topically 2 (two) times daily as needed (pain). Icy Hot roll on   Yes [provider]  naproxen sodium (ALEVE) 220 MG tablet Take  660 mg by mouth daily as needed (pain).   Yes [provider]  oxyCODONE-acetaminophen (PERCOCET) 5-325 MG tablet Take 1-2 tablets by mouth every 4 (four) hours as needed. Patient taking differently: Take 1 tablet by mouth daily as needed (pain). 09/09/21  Yes Pollina, Gwenyth Allegra, MD         JD Rexene Agent [facility] 04/21/2022, 10:49 AM  Please see [email or web address] for pager details.  From 7A-7P if no response, please call [phone]. After hours, please call ELink [phone].

## 2022-04-21 NOTE — Progress Notes (Signed)
  Amiodarone Drug - Drug Interaction Consult Note  Recommendations: No current interactions.  Amiodarone is metabolized by the cytochrome P450 system and therefore has the potential to cause many drug interactions. Amiodarone has an average plasma half-life of 50 days (range 20 to 100 days).   There is potential for drug interactions to occur several weeks or months after stopping treatment and the onset of drug interactions may be slow after initiating amiodarone.   []  Statins: Increased risk of myopathy. Simvastatin- restrict dose to 20mg  daily. Other statins: counsel patients to report any muscle pain or weakness immediately.  []  Anticoagulants: Amiodarone can increase anticoagulant effect. Consider warfarin dose reduction. Patients should be monitored closely and the dose of anticoagulant altered accordingly, remembering that amiodarone levels take several weeks to stabilize.  []  Antiepileptics: Amiodarone can increase plasma concentration of phenytoin, the dose should be reduced. Note that small changes in phenytoin dose can result in large changes in levels. Monitor patient and counsel on signs of toxicity.  []  Beta blockers: increased risk of bradycardia, AV block and myocardial depression. Sotalol - avoid concomitant use.  []   Calcium channel blockers (diltiazem and verapamil): increased risk of bradycardia, AV block and myocardial depression.  []   Cyclosporine: Amiodarone increases levels of cyclosporine. Reduced dose of cyclosporine is recommended.  []  Digoxin dose should be halved when amiodarone is started.  []  Diuretics: increased risk of cardiotoxicity if hypokalemia occurs.  []  Oral hypoglycemic agents (glyburide, glipizide, glimepiride): increased risk of hypoglycemia. Patient's glucose levels should be monitored closely when initiating amiodarone therapy.   []  Drugs that prolong the QT interval:  Torsades de pointes risk may be increased with concurrent use - avoid if  possible.  Monitor QTc, also keep magnesium/potassium WNL if concurrent therapy can't be avoided.  Antibiotics: e.g. fluoroquinolones, erythromycin.  Antiarrhythmics: e.g. quinidine, procainamide, disopyramide, sotalol.  Antipsychotics: e.g. phenothiazines, haloperidol.   Lithium, tricyclic antidepressants, and methadone.  Wynona Neat, PharmD, BCPS 04/21/2022 1:00 AM

## 2022-04-21 NOTE — Progress Notes (Signed)
  Echocardiogram 2D Echocardiogram has been performed.  Lana Fish 04/21/2022, 3:49 PM

## 2022-04-22 ENCOUNTER — Telehealth: Payer: Self-pay

## 2022-04-22 ENCOUNTER — Other Ambulatory Visit: Payer: Self-pay | Admitting: Student

## 2022-04-22 DIAGNOSIS — R9431 Abnormal electrocardiogram [ECG] [EKG]: Secondary | ICD-10-CM

## 2022-04-22 DIAGNOSIS — I4510 Unspecified right bundle-branch block: Secondary | ICD-10-CM

## 2022-04-22 LAB — EXPECTORATED SPUTUM ASSESSMENT W GRAM STAIN, RFLX TO RESP C

## 2022-04-22 LAB — CULTURE, BLOOD (ROUTINE X 2): Special Requests: ADEQUATE

## 2022-04-22 LAB — CBC
HCT: 35 % — ABNORMAL LOW (ref 39.0–52.0)
Hemoglobin: 12.5 g/dL — ABNORMAL LOW (ref 13.0–17.0)
MCH: 31.2 pg (ref 26.0–34.0)
MCHC: 35.7 g/dL (ref 30.0–36.0)
MCV: 87.3 fL (ref 80.0–100.0)
Platelets: 197 10*3/uL (ref 150–400)
RBC: 4.01 MIL/uL — ABNORMAL LOW (ref 4.22–5.81)
RDW: 13.8 % (ref 11.5–15.5)
WBC: 6.7 10*3/uL (ref 4.0–10.5)
nRBC: 0 % (ref 0.0–0.2)

## 2022-04-22 LAB — BASIC METABOLIC PANEL
Anion gap: 8 (ref 5–15)
BUN: 8 mg/dL (ref 6–20)
CO2: 20 mmol/L — ABNORMAL LOW (ref 22–32)
Calcium: 8.1 mg/dL — ABNORMAL LOW (ref 8.9–10.3)
Chloride: 104 mmol/L (ref 98–111)
Creatinine, Ser: 0.88 mg/dL (ref 0.61–1.24)
GFR, Estimated: >60 mL/min (ref >60)
Glucose, Bld: 99 mg/dL (ref 70–99)
Potassium: 3.3 mmol/L — ABNORMAL LOW (ref 3.5–5.1)
Sodium: 132 mmol/L — ABNORMAL LOW (ref 135–145)

## 2022-04-22 MED ORDER — HYDROMORPHONE HCL 1 MG/ML IJ SOLN: 0.5000 mg | INTRAMUSCULAR | Status: DC | PRN

## 2022-04-22 MED ORDER — POTASSIUM CHLORIDE CRYS ER 20 MEQ PO TBCR
40.0000 meq | EXTENDED_RELEASE_TABLET | Freq: Once | ORAL | Status: AC
  Administered 2022-04-22: 40 meq via ORAL
  Filled 2022-04-22: qty 2

## 2022-04-22 MED ORDER — METOPROLOL SUCCINATE ER 25 MG PO TB24
12.5000 mg | ORAL_TABLET | Freq: Every day | ORAL | Status: DC
  Administered 2022-04-22 – 2022-04-23 (×2): 12.5 mg via ORAL
  Filled 2022-04-22 (×2): qty 1

## 2022-04-22 NOTE — Progress Notes (Signed)
Ordered outpatient coronary calcium score at the request of Dr. Radford Pax. Also arranged follow-up visit in our office. Please see Dr. Theodosia Blender rounding note from today for additional information.  Darreld Mclean, PA-C 04/22/2022 10:04 AM

## 2022-04-22 NOTE — Telephone Encounter (Signed)
Looks like order has already been placed. CT scheduling will call patient with appointment.

## 2022-04-22 NOTE — Progress Notes (Addendum)
Progress Note  Patient Name: Scott Carrillo Date of Encounter: 04/22/2022  [facility] Cardiologist: None   Subjective   Very sleepy after getting pain meds.  Denies any chest pain or SOB.  No further afib and now Amio off and on low dose Lopressor.    Inpatient Medications    Scheduled Meds:  chlorpheniramine-HYDROcodone  5 mL Oral Q12H   enoxaparin (LOVENOX) injection  40 mg Subcutaneous Q24H   guaiFENesin  600 mg Oral BID   levofloxacin  750 mg Oral Daily   metoprolol tartrate  12.5 mg Oral BID   pantoprazole  40 mg Oral QAC lunch   sodium chloride flush  3 mL Intravenous Q12H   Continuous Infusions:  metronidazole 500 mg (04/22/22 0034)   PRN Meds: acetaminophen **OR** acetaminophen, HYDROmorphone (DILAUDID) injection, ipratropium-albuterol, ondansetron **OR** ondansetron (ZOFRAN) IV, oxyCODONE, oxyCODONE, senna-docusate   Vital Signs    Vitals:   04/22/22 0300 04/22/22 0400 04/22/22 0500 04/22/22 0600  BP:    95/75  Pulse: 88 91 94 94  Resp: 19 (!) 31 (!) 24 (!) 29  Temp:    100 F (37.8 C)  TempSrc:    Oral  SpO2: 93% 95% 93% 93%  Weight:      Height:        Intake/Output Summary (Last 24 hours) at 04/22/2022 0821 Last data filed at 04/22/2022 0300 Gross per 24 hour  Intake 716.1 ml  Output 1125 ml  Net -408.9 ml      04/19/2022   10:27 AM 09/08/2021    8:57 PM 03/20/2021   10:03 AM  Last 3 Weights  Weight (lbs) 173 lb 187 lb 6.3 oz 177 lb  Weight (kg) 78.472 kg 85 kg 80.287 kg      Telemetry    NSR - Personally Reviewed  ECG    No new EKG to review - Personally Reviewed  Physical Exam   GEN: No acute distress.   Neck: No JVD Cardiac: RRR, no murmurs, rubs, or gallops.  Respiratory: Clear to auscultation bilaterally. GI: Soft, nontender, non-distended  MS: No edema; No deformity. Neuro:  Nonfocal  Psych: Normal affect   Labs    High Sensitivity Troponin:   Recent Labs  Lab 04/19/22 1035 04/19/22 1235 04/21/22 0210   TROPONINIHS 11 9 21*      Chemistry Recent Labs  Lab 04/19/22 1035 04/20/22 0247 04/21/22 0210 04/22/22 0301  NA 135 137 135 132*  K 3.5 3.8 3.2* 3.3*  CL 106 110 105 104  CO2 20* 21* 21* 20*  GLUCOSE 138* 110* 134* 99  BUN 15 12 11 8   CREATININE 0.94 0.95 0.98 0.88  CALCIUM 8.8* 8.7* 8.7* 8.1*  PROT 6.5  --   --   --   ALBUMIN 3.0*  --   --   --   AST 30  --   --   --   ALT 25  --   --   --   ALKPHOS 84  --   --   --   BILITOT 0.6  --   --   --   GFRNONAA >60 >60 >60 >60  ANIONGAP 9 6 9 8      Hematology Recent Labs  Lab 04/20/22 0247 04/21/22 0210 04/22/22 0301  WBC 15.3* 7.3 6.7  RBC 3.77* 4.20* 4.01*  HGB 11.6* 13.1 12.5*  HCT 33.7* 37.5* 35.0*  MCV 89.4 89.3 87.3  MCH 30.8 31.2 31.2  MCHC 34.4 34.9 35.7  RDW 13.8 14.0  13.8  PLT 175 214 197    BNP Recent Labs  Lab 04/19/22 1035  BNP 165.3*     DDimer No results for input(s): "DDIMER" in the last 168 hours.   CHA2DS2-VASc Score = 0   This indicates a 0.2% annual risk of stroke. The patient's score is based upon: CHF History: 0 HTN History: 0 Diabetes History: 0 Stroke History: 0 Vascular Disease History: 0 Age Score: 0 Gender Score: 0       Radiology    ECHOCARDIOGRAM COMPLETE  Result Date: 04/21/2022    ECHOCARDIOGRAM REPORT   Patient Name:   Scott Carrillo Date of Exam: 04/21/2022 Medical Rec #:  [id number]    Height:       64.0 in Accession #:    [id number]   Weight:       173.0 lb Date of Birth:  Jun 14, 1976   BSA:          1.839 m Patient Age:    46 years     BP:           112/80 mmHg Patient Gender: M            HR:           96 bpm. Exam Location:  Inpatient Procedure: 2D Echo Indications:    abnormal ecg  History:        Patient has no prior history of Echocardiogram examinations.                 Signs/Symptoms:Chest Pain and Shortness of Breath; Risk                 Factors:Current Smoker.  Sonographer:    Harvie Junior Referring Phys: Tawanna Solo, AMRIT IMPRESSIONS  1. Left ventricular  ejection fraction, by estimation, is 55%. The left ventricle has normal function. The left ventricle has no regional wall motion abnormalities but there was septal bounce suggestive of interventricular conduction delay. Left ventricular diastolic parameters were normal.  2. Right ventricular systolic function is normal. The right ventricular size is normal. Tricuspid regurgitation signal is inadequate for assessing PA pressure.  3. The mitral valve is normal in structure. No evidence of mitral valve regurgitation. No evidence of mitral stenosis.  4. The aortic valve is tricuspid. Aortic valve regurgitation is not visualized. No aortic stenosis is present.  5. The inferior vena cava is normal in size with greater than 50% respiratory variability, suggesting right atrial pressure of 3 mmHg. FINDINGS  Left Ventricle: Left ventricular ejection fraction, by estimation, is 55%. The left ventricle has normal function. The left ventricle has no regional wall motion abnormalities. The left ventricular internal cavity size was normal in size. There is no left ventricular hypertrophy. Left ventricular diastolic parameters were normal. Right Ventricle: The right ventricular size is normal. No increase in right ventricular wall thickness. Right ventricular systolic function is normal. Tricuspid regurgitation signal is inadequate for assessing PA pressure. The tricuspid regurgitant velocity is 1.35 m/s, and with an assumed right atrial pressure of 3 mmHg, the estimated right ventricular systolic pressure is 86.5 mmHg. Left Atrium: Left atrial size was normal in size. Right Atrium: Right atrial size was normal in size. Pericardium: There is no evidence of pericardial effusion. Mitral Valve: The mitral valve is normal in structure. No evidence of mitral valve regurgitation. No evidence of mitral valve stenosis. Tricuspid Valve: The tricuspid valve is normal in structure. Tricuspid valve regurgitation is trivial. Aortic Valve: The  aortic valve is tricuspid. Aortic valve  regurgitation is not visualized. No aortic stenosis is present. Pulmonic Valve: The pulmonic valve was normal in structure. Pulmonic valve regurgitation is not visualized. Aorta: The aortic root is normal in size and structure. Venous: The inferior vena cava is normal in size with greater than 50% respiratory variability, suggesting right atrial pressure of 3 mmHg. IAS/Shunts: No atrial level shunt detected by color flow Doppler.  LEFT VENTRICLE PLAX 2D LVIDd:         5.20 cm      Diastology LVIDs:         3.60 cm      LV e' medial:    8.59 cm/s LV PW:         0.80 cm      LV E/e' medial:  10.0 LV IVS:        0.80 cm      LV e' lateral:   15.00 cm/s LVOT diam:     2.00 cm      LV E/e' lateral: 5.7 LV SV:         57 LV SV Index:   31 LVOT Area:     3.14 cm  LV Volumes (MOD) LV vol d, MOD A2C: 99.7 ml LV vol d, MOD A4C: 130.0 ml LV vol s, MOD A2C: 39.4 ml LV vol s, MOD A4C: 59.5 ml LV SV MOD A2C:     60.3 ml LV SV MOD A4C:     130.0 ml LV SV MOD BP:      65.7 ml RIGHT VENTRICLE RV Basal diam:  3.50 cm RV Mid diam:    2.40 cm RV S prime:     9.25 cm/s TAPSE (M-mode): 1.9 cm LEFT ATRIUM           Index        RIGHT ATRIUM           Index LA diam:      2.90 cm 1.58 cm/m   RA Area:     15.00 cm LA Vol (A4C): 46.1 ml 25.06 ml/m  RA Volume:   36.80 ml  20.01 ml/m  AORTIC VALVE             PULMONIC VALVE LVOT Vmax:   121.00 cm/s PV Vmax:       1.08 m/s LVOT Vmean:  77.600 cm/s PV Peak grad:  4.7 mmHg LVOT VTI:    0.182 m  AORTA Ao Root diam: 3.00 cm MITRAL VALVE               TRICUSPID VALVE MV Area (PHT): 5.13 cm    TR Peak grad:   7.3 mmHg MV Decel Time: 148 msec    TR Vmax:        135.00 cm/s MV E velocity: 85.90 cm/s MV A velocity: 62.90 cm/s  SHUNTS MV E/A ratio:  1.37        Systemic VTI:  0.18 m                            Systemic Diam: 2.00 cm Dalton McleanMD Electronically signed by Wilfred Lacy Signature Date/Time: 04/21/2022/4:36:00 PM    Final     Cardiac Studies    2D echo 04/21/22 IMPRESSIONS    1. Left ventricular ejection fraction, by estimation, is 55%. The left  ventricle has normal function. The left ventricle has no regional wall  motion abnormalities but there was septal bounce suggestive of  interventricular conduction delay.  Left  ventricular diastolic parameters were normal.   2. Right ventricular systolic function is normal. The right ventricular  size is normal. Tricuspid regurgitation signal is inadequate for assessing  PA pressure.   3. The mitral valve is normal in structure. No evidence of mitral valve  regurgitation. No evidence of mitral stenosis.   4. The aortic valve is tricuspid. Aortic valve regurgitation is not  visualized. No aortic stenosis is present.   5. The inferior vena cava is normal in size with greater than 50%  respiratory variability, suggesting right atrial pressure of 3 mmHg.   Patient Profile     46 y.o. male with a no significant PMH, admitted 04/19/22 for Rt upper chest pain and back pain along with cough and SOB who is being seen 04/21/2022 for the evaluation of atrial fib at the request of Dr. Tawanna Solo.  Assessment & Plan    Paroxysmal atrial flutter -no further episodes of afib or aflutter>>likely triggered by acute respiratory illness with pneumococcal PNA and spesis -TSH normal -Amio stopped yesterday and transitioned to Lopressor 12.5mg  BID -will consolidate to Toprol XL 12.5mg  daily due to soft BP -CHADS2VASC score is 0 so no anticoagulation at this time  RBBB -he has hx of chronic iRBBB -2D echo with normal LVF  -hsTrop normal -no hx of exertional CP>>had some pleuritic CP with PNA but does not sound cardiac -consider outpt coronary Ca score as outpt - no mention of coronary artery calcifications on Chest CTA  Sepsis with Multilobar Pneumococcal PNA -on IV antibx -per [place] will sign off.   Medication Recommendations:  Toprol XL 12.5mg  daily Other recommendations  (labs, testing, etc):  coronary Ca score outpt Follow up as an outpatient:  Followup with PA in our office in 4 weeks  For questions or updates, please contact [place] Please consult [email or web address] for contact info under        Signed, Fransico Him, MD  04/22/2022, 8:21 AM

## 2022-04-22 NOTE — Progress Notes (Signed)
PROGRESS NOTE  Scott Carrillo  [id number] DOB: August 01, 1975 DOA: 04/19/2022 PCP: Pcp, No   Brief Narrative:  Patient is a 46 year old male who is a smoker presented with complaint of chest pain, back pain, cough, congestion.  On presentation, he was mildly febrile, tachypneic.  Labs showed WBC count of 23.1, lactic acid of 2.3.  CTA chest showed right upper lobe, right middle lobe masslike consolidation with early cavitation, possible thrombus in the left IJ, left subclavian, SVC.  Also showed 5 mm lung nodule in right lower lobe, mediastinal, right hilar adenopathy. Strept pneumonia urine antigen was positive.  One of the blood cultures also showed Streptococcus pneumoniae.  Started on antibiotics.  PCCM  consulted.  Hospital course also remarkable for new onset A-fib, started on amiodarone drip, cardiology consulted, now resolved.  Currently remains on room air.  Does not feel ready to go home yet  Assessment & Plan:  Principal Problem:   Sepsis due to right upper and middle lobe cavitary pneumonia ([place]) Active Problems:   Lung nodule   Multiple thyroid nodules   Tobacco use   Pneumonia of right middle lobe due to infectious organism   Inguinal hernia, bilateral   Renal cyst   Atrial fibrillation with RVR ([place])   Sepsis secondary to right-sided pneumonia: Presented with fever, tachypnea, leukocytosis, lactic acidosis, elevated procalcitonin.CTA chest showed right upper lobe, right middle lobe masslike consolidation with early cavitation.  PCCM also following.  Also had right-sided pleuritic chest pain.  Continue pain management.  Continue cough medication, incentive spirometer. Also showed 5 mm lung nodule in the right lower lobe, mediastinal, right hilar adenopathy.  PCCM recommending follow-up as an outpatient in 4 to 6 weeks for consideration of bronchoscopy.  Continue DuoNeb for wheezing. On oral Levaquin now.  He has been weaned to room air .leukocytosis resolved.  Positive blood  culture: One of the blood cultures came out to be positive for Strept  pneumoniae.    Most likely contaminant.  Repeat blood cultures sent on 10/4  Tobacco use: Smoking cessation provided.  GERD: Continue Protonix  Bilateral inguinal hernia containing fat: Small and asymptomatic.  Hypokalemia: Supplemented with potassium  Paroxysmal A-fib:   No history of A-fib in the past.  Transient, resolved with amiodarone drip.  Cardiology started on low-dose metoprolol.  Cardiology recommends outpatient follow-up, outpatient coronary calcium score.  CHADVASc  of 0.  Echo showed EF of 55%, no regional wall motion abnormality.  DVT prophylaxis:enoxaparin (LOVENOX) injection 40 mg Start: 04/20/22 1400     Code Status: Full Code  Family Communication: Called wife for update on 10/4  Patient status:Inpatient  Patient is from :Home  Anticipated discharge NE:[id number]  Estimated DC date:1-2 days.  Patient does not feel ready to go home yet.     Consultants: PCCM  Procedures:None  Antimicrobials:  Anti-infectives (From admission, onward)    Start     Dose/Rate Route Frequency Ordered Stop   04/22/22 1000  levofloxacin (LEVAQUIN) tablet 750 mg        750 mg Oral Daily 04/21/22 1107     04/21/22 1130  levofloxacin (LEVAQUIN) IVPB 750 mg        750 mg 100 mL/hr over 90 Minutes Intravenous Every 24 hours 04/21/22 1107 04/21/22 2158   04/20/22 1200  levofloxacin (LEVAQUIN) IVPB 750 mg  Status:  Discontinued        750 mg 100 mL/hr over 90 Minutes Intravenous Every 24 hours 04/19/22 2307 04/21/22 1107   04/20/22 0000  metroNIDAZOLE (  FLAGYL) IVPB 500 mg        500 mg 100 mL/hr over 60 Minutes Intravenous Every 12 hours 04/19/22 2307     04/19/22 1145  levofloxacin (LEVAQUIN) IVPB 750 mg        750 mg 100 mL/hr over 90 Minutes Intravenous  Once 04/19/22 1142 04/19/22 1440       Subjective: Patient seen and examined at bedside today.  Remains in normal sinus rhythm.  On room air.  Continues to  have some cough.  Does not feel ready to go home yet.I encouraged him to ambulate, using incentive spirometer.   Objective: Vitals:   04/22/22 0827 04/22/22 0837 04/22/22 0923 04/22/22 1100  BP: 108/68  108/68   Pulse: 76  76   Resp: (!) 22  (!) 22   Temp: 98.7 F (37.1 C)   98.7 F (37.1 C)  TempSrc:      SpO2:  97%    Weight:      Height:        Intake/Output Summary (Last 24 hours) at 04/22/2022 1153 Last data filed at 04/22/2022 1000 Gross per 24 hour  Intake 716.1 ml  Output 1025 ml  Net -308.9 ml   Filed Weights   04/19/22 1027  Weight: 78.5 kg    Examination:  General exam: Overall comfortable, not in distress HEENT: PERRL Respiratory system:  no wheezes or crackles  Cardiovascular system: S1 & S2 heard, RRR.  Gastrointestinal system: Abdomen is nondistended, soft and nontender. Central nervous system: Alert and oriented Extremities: No edema, no clubbing ,no cyanosis Skin: No rashes, no ulcers,no icterus     Data Reviewed: I have personally reviewed following labs and imaging studies  CBC: Recent Labs  Lab 04/19/22 1035 04/20/22 0247 04/21/22 0210 04/22/22 0301  WBC 23.1* 15.3* 7.3 6.7  HGB 13.6 11.6* 13.1 12.5*  HCT 39.5 33.7* 37.5* 35.0*  MCV 88.6 89.4 89.3 87.3  PLT 200 175 214 [id number]   Basic Metabolic Panel: Recent Labs  Lab 04/19/22 1035 04/20/22 0247 04/21/22 0210 04/22/22 0301  NA 135 137 135 132*  K 3.5 3.8 3.2* 3.3*  CL 106 110 105 104  CO2 20* 21* 21* 20*  GLUCOSE 138* 110* 134* 99  BUN 15 12 11 8   CREATININE 0.94 0.95 0.98 0.88  CALCIUM 8.8* 8.7* 8.7* 8.1*  MG  --   --  1.7  --      Recent Results (from the past 240 hour(s))  Resp Panel by RT-PCR (Flu A&B, Covid) Anterior Nasal Swab     Status: None   Collection Time: 04/19/22 10:36 AM   Specimen: Anterior Nasal Swab  Result Value Ref Range Status   SARS Coronavirus 2 by RT PCR NEGATIVE NEGATIVE Final    Comment: (NOTE) SARS-CoV-2 target nucleic acids are NOT  DETECTED.  The SARS-CoV-2 RNA is generally detectable in upper respiratory specimens during the acute phase of infection. The lowest concentration of SARS-CoV-2 viral copies this assay can detect is 138 copies/mL. A negative result does not preclude SARS-Cov-2 infection and should not be used as the sole basis for treatment or other patient management decisions. A negative result may occur with  improper specimen collection/handling, submission of specimen other than nasopharyngeal swab, presence of viral mutation(s) within the areas targeted by this assay, and inadequate number of viral copies(<138 copies/mL). A negative result must be combined with clinical observations, patient history, and epidemiological information. The expected result is Negative.  Fact Sheet for Patients:  EntrepreneurPulse.com.au  Fact Sheet for Healthcare Providers:  IncredibleEmployment.be  This test is no t yet approved or cleared by the [place] FDA and  has been authorized for detection and/or diagnosis of SARS-CoV-2 by FDA under an Emergency Use Authorization (EUA). This EUA will remain  in effect (meaning this test can be used) for the duration of the COVID-19 declaration under Section 564(b)(1) of the Act, 21 U.S.C.section 360bbb-3(b)(1), unless the authorization is terminated  or revoked sooner.       Influenza A by PCR NEGATIVE NEGATIVE Final   Influenza B by PCR NEGATIVE NEGATIVE Final    Comment: (NOTE) The Xpert Xpress SARS-CoV-2/FLU/RSV plus assay is intended as an aid in the diagnosis of influenza from Nasopharyngeal swab specimens and should not be used as a sole basis for treatment. Nasal washings and aspirates are unacceptable for Xpert Xpress SARS-CoV-2/FLU/RSV testing.  Fact Sheet for Patients: EntrepreneurPulse.com.au  Fact Sheet for Healthcare Providers: IncredibleEmployment.be  This test is not yet  approved or cleared by the [place] FDA and has been authorized for detection and/or diagnosis of SARS-CoV-2 by FDA under an Emergency Use Authorization (EUA). This EUA will remain in effect (meaning this test can be used) for the duration of the COVID-19 declaration under Section 564(b)(1) of the Act, 21 U.S.C. section 360bbb-3(b)(1), unless the authorization is terminated or revoked.  Performed at [facility], [address] [place], [place] [address]   Culture, blood (routine x 2)     Status: Abnormal   Collection Time: 04/19/22 10:55 AM   Specimen: BLOOD  Result Value Ref Range Status   Specimen Description   Final    BLOOD LEFT ANTECUBITAL Performed at [facility], [address] [place], [place] [address]    Special Requests   Final    BOTTLES DRAWN AEROBIC AND ANAEROBIC Blood Culture adequate volume Performed at [facility], [address] [place], [place] [address]    Culture  Setup Time   Final    GRAM POSITIVE COCCI IN PAIRS ANAEROBIC BOTTLE ONLY CRITICAL RESULT CALLED TO, READ BACK BY AND VERIFIED WITH: T RUDISILL,PHARMD@[id number]  04/20/22 [place] Performed at [facility], [place] [address] [place], [place] [address]    Culture STREPTOCOCCUS PNEUMONIAE (A)  Final   Report Status 04/22/2022 FINAL  Final   Organism ID, Bacteria STREPTOCOCCUS PNEUMONIAE  Final      Susceptibility   Streptococcus pneumoniae - MIC*    ERYTHROMYCIN >=8 RESISTANT Resistant     LEVOFLOXACIN 1 SENSITIVE Sensitive     VANCOMYCIN 0.5 SENSITIVE Sensitive     PENO - penicillin <=0.06      PENICILLIN (non-meningitis) <=0.06 SENSITIVE Sensitive     PENICILLIN (oral) <=0.06 SENSITIVE Sensitive     CEFTRIAXONE (non-meningitis) 0.25 SENSITIVE Sensitive     * STREPTOCOCCUS PNEUMONIAE  Blood Culture ID Panel (Reflexed)     Status: Abnormal   Collection Time: 04/19/22 10:55 AM  Result Value Ref Range Status   Enterococcus faecalis NOT DETECTED NOT DETECTED  Final   Enterococcus Faecium NOT DETECTED NOT DETECTED Final   Listeria monocytogenes NOT DETECTED NOT DETECTED Final   Staphylococcus species NOT DETECTED NOT DETECTED Final   Staphylococcus aureus (BCID) NOT DETECTED NOT DETECTED Final   Staphylococcus epidermidis NOT DETECTED NOT DETECTED Final   Staphylococcus lugdunensis NOT DETECTED NOT DETECTED Final   Streptococcus species DETECTED (A) NOT DETECTED Final    Comment: CRITICAL RESULT CALLED TO, READ BACK BY AND VERIFIED  WITH: T RUDISILL,PHARMD@0537  04/20/22 [place]    Streptococcus agalactiae NOT DETECTED NOT DETECTED Final   Streptococcus pneumoniae DETECTED (A) NOT DETECTED Final    Comment: CRITICAL RESULT CALLED TO, READ BACK BY AND VERIFIED WITH: T RUDISILL,PHARMD@0537  04/20/22 [place]    Streptococcus pyogenes NOT DETECTED NOT DETECTED Final   A.calcoaceticus-baumannii NOT DETECTED NOT DETECTED Final   Bacteroides fragilis NOT DETECTED NOT DETECTED Final   Enterobacterales NOT DETECTED NOT DETECTED Final   Enterobacter cloacae complex NOT DETECTED NOT DETECTED Final   Escherichia coli NOT DETECTED NOT DETECTED Final   Klebsiella aerogenes NOT DETECTED NOT DETECTED Final   Klebsiella oxytoca NOT DETECTED NOT DETECTED Final   Klebsiella pneumoniae NOT DETECTED NOT DETECTED Final   Proteus species NOT DETECTED NOT DETECTED Final   Salmonella species NOT DETECTED NOT DETECTED Final   Serratia marcescens NOT DETECTED NOT DETECTED Final   Haemophilus influenzae NOT DETECTED NOT DETECTED Final   Neisseria meningitidis NOT DETECTED NOT DETECTED Final   Pseudomonas aeruginosa NOT DETECTED NOT DETECTED Final   Stenotrophomonas maltophilia NOT DETECTED NOT DETECTED Final   Candida albicans NOT DETECTED NOT DETECTED Final   Candida auris NOT DETECTED NOT DETECTED Final   Candida glabrata NOT DETECTED NOT DETECTED Final   Candida krusei NOT DETECTED NOT DETECTED Final   Candida parapsilosis NOT DETECTED NOT DETECTED Final   Candida  tropicalis NOT DETECTED NOT DETECTED Final   Cryptococcus neoformans/gattii NOT DETECTED NOT DETECTED Final    Comment: Performed at [facility], [place] [address] [place], [place] [address]  Culture, blood (routine x 2)     Status: None (Preliminary result)   Collection Time: 04/19/22 10:57 AM   Specimen: BLOOD  Result Value Ref Range Status   Specimen Description   Final    BLOOD RIGHT ANTECUBITAL Performed at [facility], [address] [place], [place] [address]    Special Requests   Final    BOTTLES DRAWN AEROBIC AND ANAEROBIC Blood Culture adequate volume Performed at [facility], [address] [place], [place] [address]    Culture   Final    NO GROWTH 3 DAYS Performed at [facility], [place] [address] [place], [place] [address]    Report Status PENDING  Incomplete  Expectorated Sputum Assessment w Gram Stain, Rflx to Resp Cult     Status: None   Collection Time: 04/19/22 11:08 PM   Specimen: Expectorated Sputum  Result Value Ref Range Status   Specimen Description EXPECTORATED SPUTUM  Final   Special Requests NONE  Final   Sputum evaluation   Final    Sputum specimen not acceptable for testing.  Please recollect.   NOTIFIED HEATHER STANLEY RN ON 04/21/22 @ 2059 BY DRT Performed at [facility], [place] [address], [place], [place] [address]    Report Status 04/21/2022 FINAL  Final  Culture, blood (Routine X 2) w Reflex to ID Panel     Status: None (Preliminary result)   Collection Time: 04/21/22  2:37 PM   Specimen: BLOOD  Result Value Ref Range Status   Specimen Description BLOOD SITE NOT SPECIFIED  Final   Special Requests   Final    BOTTLES DRAWN AEROBIC AND ANAEROBIC Blood Culture adequate volume   Culture   Final    NO GROWTH < 24 HOURS Performed at [facility], [place] [address], [place], [place] [address]    Report Status PENDING  Incomplete  Culture,  blood (Routine X 2) w Reflex to ID Panel     Status: None  (Preliminary result)   Collection Time: 04/21/22  2:37 PM   Specimen: BLOOD RIGHT ARM  Result Value Ref Range Status   Specimen Description BLOOD RIGHT ARM  Final   Special Requests   Final    BOTTLES DRAWN AEROBIC AND ANAEROBIC Blood Culture results may not be optimal due to an inadequate volume of blood received in culture bottles   Culture   Final    NO GROWTH < 24 HOURS Performed at [facility], [place] [address] [place], [place] [address]    Report Status PENDING  Incomplete  Expectorated Sputum Assessment w Gram Stain, Rflx to Resp Cult     Status: None (Preliminary result)   Collection Time: 04/22/22  6:28 AM   Specimen: Expectorated Sputum  Result Value Ref Range Status   Specimen Description EXPECTORATED SPUTUM  Final   Special Requests NONE  Final   Sputum evaluation   Final    Sputum specimen not acceptable for testing.  Please recollect.   CRITICAL RESULT CALLED TO, READ BACK BY AND VERIFIED WITH: 04/22/22 @0927  RN REIVES BY AB Performed at [facility], [place] [address] [place],  [address]    Report Status PENDING  Incomplete     Radiology Studies: ECHOCARDIOGRAM COMPLETE  Result Date: 04/21/2022    ECHOCARDIOGRAM REPORT   Patient Name:   [name] Date of Exam: 04/21/2022 Medical Rec #:  [id number]    Height:       64.0 in Accession #:    [id number]   Weight:       173.0 lb Date of Birth:  [birth year]   BSA:          1.839 m Patient Age:    43 years     BP:           112/80 mmHg Patient Gender: M            HR:           96 bpm. Exam Location:  Inpatient Procedure: 2D Echo Indications:    abnormal ecg  History:        Patient has no prior history of Echocardiogram examinations.                 Signs/Symptoms:Chest Pain and Shortness of Breath; Risk                 Factors:Current Smoker.  Sonographer:    Harvie Junior Referring Phys: Tawanna Solo, Janoah Menna IMPRESSIONS  1. Left ventricular ejection fraction, by estimation, is 55%. The left ventricle has normal  function. The left ventricle has no regional wall motion abnormalities but there was septal bounce suggestive of interventricular conduction delay. Left ventricular diastolic parameters were normal.  2. Right ventricular systolic function is normal. The right ventricular size is normal. Tricuspid regurgitation signal is inadequate for assessing PA pressure.  3. The mitral valve is normal in structure. No evidence of mitral valve regurgitation. No evidence of mitral stenosis.  4. The aortic valve is tricuspid. Aortic valve regurgitation is not visualized. No aortic stenosis is present.  5. The inferior vena cava is normal in size with greater than 50% respiratory variability, suggesting right atrial pressure of 3 mmHg. FINDINGS  Left Ventricle: Left ventricular ejection fraction, by estimation, is 55%. The left ventricle has normal function. The left ventricle has no regional wall motion abnormalities. The left ventricular internal cavity  size was normal in size. There is no left ventricular hypertrophy. Left ventricular diastolic parameters were normal. Right Ventricle: The right ventricular size is normal. No increase in right ventricular wall thickness. Right ventricular systolic function is normal. Tricuspid regurgitation signal is inadequate for assessing PA pressure. The tricuspid regurgitant velocity is 1.35 m/s, and with an assumed right atrial pressure of 3 mmHg, the estimated right ventricular systolic pressure is 123456 mmHg. Left Atrium: Left atrial size was normal in size. Right Atrium: Right atrial size was normal in size. Pericardium: There is no evidence of pericardial effusion. Mitral Valve: The mitral valve is normal in structure. No evidence of mitral valve regurgitation. No evidence of mitral valve stenosis. Tricuspid Valve: The tricuspid valve is normal in structure. Tricuspid valve regurgitation is trivial. Aortic Valve: The aortic valve is tricuspid. Aortic valve regurgitation is not visualized.  No aortic stenosis is present. Pulmonic Valve: The pulmonic valve was normal in structure. Pulmonic valve regurgitation is not visualized. Aorta: The aortic root is normal in size and structure. Venous: The inferior vena cava is normal in size with greater than 50% respiratory variability, suggesting right atrial pressure of 3 mmHg. IAS/Shunts: No atrial level shunt detected by color flow Doppler.  LEFT VENTRICLE PLAX 2D LVIDd:         5.20 cm      Diastology LVIDs:         3.60 cm      LV e' medial:    8.59 cm/s LV PW:         0.80 cm      LV E/e' medial:  10.0 LV IVS:        0.80 cm      LV e' lateral:   15.00 cm/s LVOT diam:     2.00 cm      LV E/e' lateral: 5.7 LV SV:         57 LV SV Index:   31 LVOT Area:     3.14 cm  LV Volumes (MOD) LV vol d, MOD A2C: 99.7 ml LV vol d, MOD A4C: 130.0 ml LV vol s, MOD A2C: 39.4 ml LV vol s, MOD A4C: 59.5 ml LV SV MOD A2C:     60.3 ml LV SV MOD A4C:     130.0 ml LV SV MOD BP:      65.7 ml RIGHT VENTRICLE RV Basal diam:  3.50 cm RV Mid diam:    2.40 cm RV S prime:     9.25 cm/s TAPSE (M-mode): 1.9 cm LEFT ATRIUM           Index        RIGHT ATRIUM           Index LA diam:      2.90 cm 1.58 cm/m   RA Area:     15.00 cm LA Vol (A4C): 46.1 ml 25.06 ml/m  RA Volume:   36.80 ml  20.01 ml/m  AORTIC VALVE             PULMONIC VALVE LVOT Vmax:   121.00 cm/s PV Vmax:       1.08 m/s LVOT Vmean:  77.600 cm/s PV Peak grad:  4.7 mmHg LVOT VTI:    0.182 m  AORTA Ao Root diam: 3.00 cm MITRAL VALVE               TRICUSPID VALVE MV Area (PHT): 5.13 cm    TR Peak grad:   7.3 mmHg MV Decel Time:  148 msec    TR Vmax:        135.00 cm/s MV E velocity: 85.90 cm/s MV A velocity: 62.90 cm/s  SHUNTS MV E/A ratio:  1.37        Systemic VTI:  0.18 m                            Systemic Diam: 2.00 cm Dalton McleanMD Electronically signed by Franki Monte Signature Date/Time: 04/21/2022/4:36:00 PM    Final     Scheduled Meds:  chlorpheniramine-HYDROcodone  5 mL Oral Q12H   enoxaparin (LOVENOX)  injection  40 mg Subcutaneous Q24H   guaiFENesin  600 mg Oral BID   levofloxacin  750 mg Oral Daily   metoprolol succinate  12.5 mg Oral Daily   pantoprazole  40 mg Oral QAC lunch   sodium chloride flush  3 mL Intravenous Q12H   Continuous Infusions:  metronidazole 500 mg (04/22/22 0034)     LOS: 3 days   Shelly Coss, MD [facility] P10/11/2021, 11:53 AM

## 2022-04-22 NOTE — Telephone Encounter (Signed)
-----   Message from Darreld Mclean, [place] sent at 04/22/2022  8:37 AM EDT ----- Regarding: Coronary Calcium Score Morning!  Dr. Radford Pax saw this patient in the hospital today and recommended outpatient coronary calcium score and then follow-up with an APP in 1 month. I already got him his follow-up visit with Scott on 05/24/2022. However, can you all please help me arrange a coronary calcium score? If it could be done before his follow-up visit, that would be great.  Thank you so much! Callie

## 2022-04-23 LAB — BASIC METABOLIC PANEL
Anion gap: 8 (ref 5–15)
BUN: 10 mg/dL (ref 6–20)
CO2: 21 mmol/L — ABNORMAL LOW (ref 22–32)
Calcium: 8.5 mg/dL — ABNORMAL LOW (ref 8.9–10.3)
Chloride: 106 mmol/L (ref 98–111)
Creatinine, Ser: 0.78 mg/dL (ref 0.61–1.24)
GFR, Estimated: >60 mL/min (ref >60)
Glucose, Bld: 98 mg/dL (ref 70–99)
Potassium: 3.7 mmol/L (ref 3.5–5.1)
Sodium: 135 mmol/L (ref 135–145)

## 2022-04-23 MED ORDER — GUAIFENESIN ER 600 MG PO TB12: 600.0000 mg | ORAL_TABLET | Freq: Two times a day (BID) | ORAL | 0 refills | Status: AC

## 2022-04-23 MED ORDER — LEVOFLOXACIN 750 MG PO TABS: 750.0000 mg | ORAL_TABLET | Freq: Every day | ORAL | 0 refills | Status: AC

## 2022-04-23 MED ORDER — METOPROLOL SUCCINATE ER 25 MG PO TB24: 12.5000 mg | ORAL_TABLET | Freq: Every day | ORAL | 0 refills | Status: AC

## 2022-04-23 NOTE — Plan of Care (Signed)

## 2022-04-23 NOTE — Telephone Encounter (Signed)
ATC pt phone rang twice and then switched over to busy signal. Will attempt later.

## 2022-04-23 NOTE — Discharge Summary (Signed)
Physician Discharge Summary  Scott Carrillo [id number] DOB: 1975-12-26 DOA: 04/19/2022  PCP: Pcp, No  Admit date: 04/19/2022 Discharge date: 04/23/2022  Admitted From: Home Disposition:  Home  Discharge Condition:Stable CODE STATUS:FULL Diet recommendation: Regular  Brief/Interim Summary: Patient is a 46 year old male who is a smoker presented with complaint of chest pain, back pain, cough, congestion.  On presentation, he was mildly febrile, tachypneic.  Labs showed WBC count of 23.1, lactic acid of 2.3.  CTA chest showed right upper lobe, right middle lobe masslike consolidation with early cavitation, possible thrombus in the left IJ, left subclavian, SVC.  Also showed 5 mm lung nodule in right lower lobe, mediastinal, right hilar adenopathy. Strept pneumonia urine antigen was positive.  One of the blood cultures also showed Streptococcus pneumoniae.  Started on antibiotics.  PCCM  consulted.  Hospital course also remarkable for new onset A-fib, started on amiodarone drip, cardiology consulted, now resolved.  Currently remains on room air.  Medically stable for discharge today with oral antibiotics.  He will follow-up with cardiology, pulmonology as an outpatient.  Following problems were addressed during his hospitalization:   Sepsis secondary to right-sided pneumonia: Presented with fever, tachypnea, leukocytosis, lactic acidosis, elevated procalcitonin.CTA chest showed right upper lobe, right middle lobe mass like consolidation with early cavitation.  PCCM also were following.  Also had right-sided pleuritic chest pain.  Also showed 5 mm lung nodule in the right lower lobe, mediastinal, right hilar adenopathy.  PCCM recommending follow-up as an outpatient in 4 to 6 weeks for consideration of bronchoscopy. On oral Levaquin now.  He has been weaned to room air .leukocytosis resolved.   Positive blood culture: One of the blood cultures came out to be positive for Strept  pneumoniae.    Most  likely contaminant.  Repeat blood cultures sent on 10/4, NGTD   Tobacco use: Smoking cessation provided.   Bilateral inguinal hernia containing fat: Small and asymptomatic.   Hypokalemia: Supplemented with potassium   Paroxysmal A-fib:   No history of A-fib in the past.  Transient, resolved with amiodarone drip.  Cardiology started on low-dose metoprolol.  Cardiology recommends outpatient follow-up, outpatient coronary calcium score.  CHADVASc  of 0.  Echo showed EF of 55%, no regional wall motion abnormality.   Discharge Diagnoses:  Principal Problem:   Sepsis due to right upper and middle lobe cavitary pneumonia ([place]) Active Problems:   Lung nodule   Multiple thyroid nodules   Tobacco use   Pneumonia of right middle lobe due to infectious organism   Inguinal hernia, bilateral   Renal cyst   Atrial fibrillation with RVR [facility])    Discharge Instructions  Discharge Instructions     Diet general   Complete by: As directed    Discharge instructions   Complete by: As directed    1)Please take prescribed medications as instructed 2)Follow up with cardiology and pulmonology as an outpatient.  Name and number the providers have been attached   Increase activity slowly   Complete by: As directed       Allergies as of 04/23/2022       Reactions   Chocolate Anaphylaxis   Penicillins Anaphylaxis   Pt reported hives + throat swelling to PCN as child s/p wisdom teeth removal. No rxn noted per pt when received CTX in 2017.    Bee Venom Hives        Medication List     TAKE these medications    guaiFENesin 600 MG 12 hr tablet Commonly  known as: MUCINEX Take 1 tablet (600 mg total) by mouth 2 (two) times daily for 7 days.   ICY HOT ORIGINAL PAIN RELIEF EX Apply 1 application  topically 2 (two) times daily as needed (pain). Icy Hot roll on   levofloxacin 750 MG tablet Commonly known as: LEVAQUIN Take 1 tablet (750 mg total) by mouth daily for 5 days. Start taking on:  April 24, 2022   metoprolol succinate 25 MG 24 hr tablet Commonly known as: TOPROL-XL Take 0.5 tablets (12.5 mg total) by mouth daily. Start taking on: April 24, 2022   naproxen sodium 220 MG tablet Commonly known as: ALEVE Take 660 mg by mouth daily as needed (pain).   oxyCODONE-acetaminophen 5-325 MG tablet Commonly known as: Percocet Take 1-2 tablets by mouth every 4 (four) hours as needed. What changed:  how much to take when to take this reasons to take this        Follow-up Information     Tereso NewcomerWeaver, Scott T, PA-C Follow up.   Specialties: Cardiology, Physician Assistant Why: Follow-up with Cardiology scheduled for 05/24/2022 at 9:15am. Please arrive 15 minutes early for check-in. if this date/time does not work for you, please call our office to reschedule. Our office will also call you to arrange coronary calcium score. Contact information: [address] [place] [address] [phone]         Leslye PeerByrum, Robert S, MD. Schedule an appointment as soon as possible for a visit in 4 week(s).   Specialty: Pulmonary Disease Contact information: [address] [place] [id number] [phone]                Allergies  Allergen Reactions   Chocolate Anaphylaxis   Penicillins Anaphylaxis    Pt reported hives + throat swelling to PCN as child s/p wisdom teeth removal. No rxn noted per pt when received CTX in 2017.    Bee Venom Hives    Consultations: PCCM,cardiology   Procedures/Studies: ECHOCARDIOGRAM COMPLETE  Result Date: 04/21/2022    ECHOCARDIOGRAM REPORT   Patient Name:   Scott Carrillo Date of Exam: 04/21/2022 Medical Rec #:  [id number]    Height:       64.0 in Accession #:    [id number]   Weight:       173.0 lb Date of Birth:  Jun 20, 1976   BSA:          1.839 m Patient Age:    45 years     BP:           112/80 mmHg Patient Gender: M            HR:           96 bpm. Exam Location:  Inpatient Procedure: 2D Echo  Indications:    abnormal ecg  History:        Patient has no prior history of Echocardiogram examinations.                 Signs/Symptoms:Chest Pain and Shortness of Breath; Risk                 Factors:Current Smoker.  Sonographer:    Cathie HoopsWendy Porter Referring Phys: Renford DillsADHIKARI, Kedar Sedano IMPRESSIONS  1. Left ventricular ejection fraction, by estimation, is 55%. The left ventricle has normal function. The left ventricle has no regional wall motion abnormalities but there was septal bounce suggestive of interventricular conduction delay. Left ventricular diastolic parameters were normal.  2. Right ventricular systolic  function is normal. The right ventricular size is normal. Tricuspid regurgitation signal is inadequate for assessing PA pressure.  3. The mitral valve is normal in structure. No evidence of mitral valve regurgitation. No evidence of mitral stenosis.  4. The aortic valve is tricuspid. Aortic valve regurgitation is not visualized. No aortic stenosis is present.  5. The inferior vena cava is normal in size with greater than 50% respiratory variability, suggesting right atrial pressure of 3 mmHg. FINDINGS  Left Ventricle: Left ventricular ejection fraction, by estimation, is 55%. The left ventricle has normal function. The left ventricle has no regional wall motion abnormalities. The left ventricular internal cavity size was normal in size. There is no left ventricular hypertrophy. Left ventricular diastolic parameters were normal. Right Ventricle: The right ventricular size is normal. No increase in right ventricular wall thickness. Right ventricular systolic function is normal. Tricuspid regurgitation signal is inadequate for assessing PA pressure. The tricuspid regurgitant velocity is 1.35 m/s, and with an assumed right atrial pressure of 3 mmHg, the estimated right ventricular systolic pressure is 123456 mmHg. Left Atrium: Left atrial size was normal in size. Right Atrium: Right atrial size was normal in size.  Pericardium: There is no evidence of pericardial effusion. Mitral Valve: The mitral valve is normal in structure. No evidence of mitral valve regurgitation. No evidence of mitral valve stenosis. Tricuspid Valve: The tricuspid valve is normal in structure. Tricuspid valve regurgitation is trivial. Aortic Valve: The aortic valve is tricuspid. Aortic valve regurgitation is not visualized. No aortic stenosis is present. Pulmonic Valve: The pulmonic valve was normal in structure. Pulmonic valve regurgitation is not visualized. Aorta: The aortic root is normal in size and structure. Venous: The inferior vena cava is normal in size with greater than 50% respiratory variability, suggesting right atrial pressure of 3 mmHg. IAS/Shunts: No atrial level shunt detected by color flow Doppler.  LEFT VENTRICLE PLAX 2D LVIDd:         5.20 cm      Diastology LVIDs:         3.60 cm      LV e' medial:    8.59 cm/s LV PW:         0.80 cm      LV E/e' medial:  10.0 LV IVS:        0.80 cm      LV e' lateral:   15.00 cm/s LVOT diam:     2.00 cm      LV E/e' lateral: 5.7 LV SV:         57 LV SV Index:   31 LVOT Area:     3.14 cm  LV Volumes (MOD) LV vol d, MOD A2C: 99.7 ml LV vol d, MOD A4C: 130.0 ml LV vol s, MOD A2C: 39.4 ml LV vol s, MOD A4C: 59.5 ml LV SV MOD A2C:     60.3 ml LV SV MOD A4C:     130.0 ml LV SV MOD BP:      65.7 ml RIGHT VENTRICLE RV Basal diam:  3.50 cm RV Mid diam:    2.40 cm RV S prime:     9.25 cm/s TAPSE (M-mode): 1.9 cm LEFT ATRIUM           Index        RIGHT ATRIUM           Index LA diam:      2.90 cm 1.58 cm/m   RA Area:     15.00 cm  LA Vol (A4C): 46.1 ml 25.06 ml/m  RA Volume:   36.80 ml  20.01 ml/m  AORTIC VALVE             PULMONIC VALVE LVOT Vmax:   121.00 cm/s PV Vmax:       1.08 m/s LVOT Vmean:  77.600 cm/s PV Peak grad:  4.7 mmHg LVOT VTI:    0.182 m  AORTA Ao Root diam: 3.00 cm MITRAL VALVE               TRICUSPID VALVE MV Area (PHT): 5.13 cm    TR Peak grad:   7.3 mmHg MV Decel Time: 148 msec     TR Vmax:        135.00 cm/s MV E velocity: 85.90 cm/s MV A velocity: 62.90 cm/s  SHUNTS MV E/A ratio:  1.37        Systemic VTI:  0.18 m                            Systemic Diam: 2.00 cm Dalton McleanMD Electronically signed by Wilfred Lacy Signature Date/Time: 04/21/2022/4:36:00 PM    Final    US Venous Img Upper Left (DVT Study)  Result Date: 04/19/2022 CLINICAL DATA:  Possible thrombus seen on recent CT imaging EXAM: LEFT UPPER EXTREMITY VENOUS DOPPLER ULTRASOUND TECHNIQUE: Gray-scale sonography with graded compression, as well as color Doppler and duplex ultrasound were performed to evaluate the upper extremity deep venous system from the level of the subclavian vein and including the jugular, axillary, basilic, radial, ulnar and upper cephalic vein. Spectral Doppler was utilized to evaluate flow at rest and with distal augmentation maneuvers. COMPARISON:  None Available. FINDINGS: Contralateral Subclavian Vein: Respiratory phasicity is normal and symmetric with the symptomatic side. No evidence of thrombus. Normal compressibility. Internal Jugular Vein: No evidence of thrombus. Normal compressibility, respiratory phasicity and response to augmentation. Subclavian Vein: No evidence of thrombus. Normal compressibility, respiratory phasicity and response to augmentation. Axillary Vein: No evidence of thrombus. Normal compressibility, respiratory phasicity and response to augmentation. Cephalic Vein: No evidence of thrombus. Normal compressibility, respiratory phasicity and response to augmentation. Basilic Vein: No evidence of thrombus. Normal compressibility, respiratory phasicity and response to augmentation. Brachial Veins: No evidence of thrombus. Normal compressibility, respiratory phasicity and response to augmentation. Radial Veins: No evidence of thrombus. Normal compressibility, respiratory phasicity and response to augmentation. Ulnar Veins: No evidence of thrombus. Normal compressibility,  respiratory phasicity and response to augmentation. Venous Reflux:  None visualized. Other Findings:  None visualized. IMPRESSION: No evidence of DVT within the left upper extremity. Electronically Signed   By: Malachy Moan M.D.   On: 04/19/2022 14:50   CT Angio Chest/Abd/Pel for Dissection W and/or W/WO  Result Date: 04/19/2022 CLINICAL DATA:  Acute aortic syndrome suspected. Chest pain, back pain, cough. Nausea, headache, shortness of breath. The EXAM: CT ANGIOGRAPHY CHEST, ABDOMEN AND PELVIS TECHNIQUE: Non-contrast CT of the chest was initially obtained. Multidetector CT imaging through the chest, abdomen and pelvis was performed using the standard protocol during bolus administration of intravenous contrast. Multiplanar reconstructed images and MIPs were obtained and reviewed to evaluate the vascular anatomy. RADIATION DOSE REDUCTION: This exam was performed according to the departmental dose-optimization program which includes automated exposure control, adjustment of the mA and/or kV according to patient size and/or use of iterative reconstruction technique. CONTRAST:  OMNIPAQUE IOHEXOL 350 MG/ML SOLN COMPARISON:  Chest x-ray on 04/19/2022 FINDINGS: CTA CHEST FINDINGS Cardiovascular: Heart  is normal in size. No pericardial effusion. No evidence for aortic dissection or aneurysm. The pulmonary arteries are normal in appearance accounting for contrast bolus timing. Multiple collaterals are identified in the LEFT shoulder region. There is possible thrombus within the LOWER LEFT internal jugular vein (6:1). Possible thrombus versus mixing of opacified and non-opacified blood within the LEFT subclavian/superior vena cava. Recommend further evaluation with LEFT UPPER extremity venous doppler. Mediastinum/Nodes: Multiple thyroid nodules are identified. Within the isthmus, the largest is 1.7 centimeters (6:10). Additional thyroid nodules are identified in the LEFT lobe. Mediastinal adenopathy noted,  largest RIGHT paratracheal lymph node measures 1.2 centimeters. There is confluent opacity in the RIGHT hilar region, precluding evaluation for discrete lymph nodes. However, adenopathy in this region is suspected. No significant LEFT hilar adenopathy. Lungs/Pleura: There is masslike consolidation of the RIGHT UPPER lobe and RIGHT middle lobe, with early cavitation noted in these regions. The involved area is estimated to measure 9.1 x 10.1 x 5.4 centimeters. Consolidation and/or atelectasis present within the RIGHT LOWER lobe. Ground-glass pulmonary nodule in the RIGHT LOWER lobe is 5 millimeters on image 7:26. LEFT lung is clear. No pleural effusions. Musculoskeletal: No chest wall abnormality. No acute or significant osseous findings. Review of the MIP images confirms the above findings. CTA ABDOMEN AND PELVIS FINDINGS VASCULAR Aorta: Normal caliber aorta without aneurysm, dissection, vasculitis or significant stenosis. Celiac: Patent without evidence of aneurysm, dissection, vasculitis or significant stenosis. SMA: Patent without evidence of aneurysm, dissection, vasculitis or significant stenosis. Renals: Both renal arteries are patent without evidence of aneurysm, dissection, vasculitis, fibromuscular dysplasia or significant stenosis. IMA: Patent without evidence of aneurysm, dissection, vasculitis or significant stenosis. Inflow: Patent without evidence of aneurysm, dissection, vasculitis or significant stenosis. Veins: No obvious venous abnormality within the limitations of this arterial phase study. Review of the MIP images confirms the above findings. NON-VASCULAR Hepatobiliary: No focal liver abnormality is seen. No gallstones, gallbladder wall thickening, or biliary dilatation. Pancreas: Unremarkable. No pancreatic ductal dilatation or surrounding inflammatory changes. Spleen: Normal in size without focal abnormality. Adrenals/Urinary Tract: Normal adrenal glands. Bilateral renal cysts, largest in the  RIGHT kidney measuring 2.1 centimeters. No further imaging is recommended. No hydronephrosis. Ureters are unremarkable. The bladder and visualized portion of the urethra are normal. Stomach and small bowel loops are normal in appearance. The appendix is well seen and normal in appearance. Colon is unremarkable. Lymphatic: Unremarkable. Reproductive: Prostatic calcifications noted. Other: No ascites.  Small fat containing bilateral inguinal hernias. Musculoskeletal: No acute or significant osseous findings. Review of the MIP images confirms the above findings. IMPRESSION: 1. Masslike consolidation of the RIGHT UPPER lobe and RIGHT middle lobe, with early cavitation present. The involved area is estimated to measure 9.1 x 10.1 x 5.4 centimeters. Although the findings are suspicious for malignancy, the rapid change since prior chest x-ray performed on 09/08/2021 suggest infectious/inflammatory causes. Necrotizing infection should be excluded. 2. Discrete 5 millimeter ground-glass pulmonary nodule in the RIGHT LOWER lobe. 3. Mediastinal and RIGHT hilar adenopathy. 4. No evidence for aortic dissection or aneurysm. 5. Possible thrombus within the LEFT internal jugular, subclavian vein, and superior vena cava. Recommend further evaluation with LEFT UPPER extremity venous Doppler exam. 6. Bilateral renal cysts. No follow-up imaging is recommended. JACR 2018 Feb; 264-273, Management of the Incidental RenalMass on CT, RadioGraphics 2021; 814-848, Bosniak Classification of Cystic Renal Masses, Version 2019. 7. Normal appendix. 8. Small fat containing bilateral inguinal hernias. 9. Multiple thyroid nodules, the largest measuring 1.7 centimeters. Recommend non-emergent thyroid ultrasound.  Reference: J Am Coll Radiol. 2015 Feb;12(2): 143-50 Electronically Signed   By: Nolon Nations M.D.   On: 04/19/2022 13:42   DG Chest Portable 1 View  Result Date: 04/19/2022 CLINICAL DATA:  Shortness of breath with cough and headache.  EXAM: PORTABLE CHEST 1 VIEW COMPARISON:  Chest radiograph 09/07/2018 FINDINGS: The cardiomediastinal silhouette is normal. There is confluent opacity projecting over the right lower lobe. The right upper lobe is well-aerated. The left lung is clear. There is a small right pleural effusion. There is no significant left effusion. There is no pneumothorax There is no acute osseous abnormality. IMPRESSION: Confluent right lower lobe opacity raising concern for pneumonia with small parapneumonic effusion. Recommend follow-up radiographs in 3-4 weeks to ensure resolution. Electronically Signed   By: Valetta Mole M.D.   On: 04/19/2022 11:13      Subjective: Patient seen and examined at bedside today.  Hemodynamically stable for discharge.Called and discussed with significant other on phone today about discharge planning  Discharge Exam: Vitals:   04/23/22 0707 04/23/22 0823  BP:  114/67  Pulse:  92  Resp:  15  Temp: 99.1 F (37.3 C) 98.3 F (36.8 C)  SpO2:  97%   Vitals:   04/23/22 0118 04/23/22 0534 04/23/22 0707 04/23/22 0823  BP: 111/74 107/75  114/67  Pulse: 83 (!) 110  92  Resp: (!) 23 20  15   Temp: 98.7 F (37.1 C) (!) 100.9 F (38.3 C) 99.1 F (37.3 C) 98.3 F (36.8 C)  TempSrc: Oral Oral  Oral  SpO2: 94% 93%  97%  Weight:      Height:        General: Pt is alert, awake, not in acute distress Cardiovascular: RRR, S1/S2 +, no rubs, no gallops Respiratory: CTA bilaterally, no wheezing, no rhonchi Abdominal: Soft, NT, ND, bowel sounds + Extremities: no edema, no cyanosis    The results of significant diagnostics from this hospitalization (including imaging, microbiology, ancillary and laboratory) are listed below for reference.     Microbiology: Recent Results (from the past 240 hour(s))  Resp Panel by RT-PCR (Flu A&B, Covid) Anterior Nasal Swab     Status: None   Collection Time: 04/19/22 10:36 AM   Specimen: Anterior Nasal Swab  Result Value Ref Range Status   SARS  Coronavirus 2 by RT PCR NEGATIVE NEGATIVE Final    Comment: (NOTE) SARS-CoV-2 target nucleic acids are NOT DETECTED.  The SARS-CoV-2 RNA is generally detectable in upper respiratory specimens during the acute phase of infection. The lowest concentration of SARS-CoV-2 viral copies this assay can detect is 138 copies/mL. A negative result does not preclude SARS-Cov-2 infection and should not be used as the sole basis for treatment or other patient management decisions. A negative result may occur with  improper specimen collection/handling, submission of specimen other than nasopharyngeal swab, presence of viral mutation(s) within the areas targeted by this assay, and inadequate number of viral copies(<138 copies/mL). A negative result must be combined with clinical observations, patient history, and epidemiological information. The expected result is Negative.  Fact Sheet for Patients:  EntrepreneurPulse.com.au  Fact Sheet for Healthcare Providers:  IncredibleEmployment.be  This test is no t yet approved or cleared by the [place] FDA and  has been authorized for detection and/or diagnosis of SARS-CoV-2 by FDA under an Emergency Use Authorization (EUA). This EUA will remain  in effect (meaning this test can be used) for the duration of the COVID-19 declaration under Section 564(b)(1) of the Act, 21  U.S.C.section 360bbb-3(b)(1), unless the authorization is terminated  or revoked sooner.       Influenza A by PCR NEGATIVE NEGATIVE Final   Influenza B by PCR NEGATIVE NEGATIVE Final    Comment: (NOTE) The Xpert Xpress SARS-CoV-2/FLU/RSV plus assay is intended as an aid in the diagnosis of influenza from Nasopharyngeal swab specimens and should not be used as a sole basis for treatment. Nasal washings and aspirates are unacceptable for Xpert Xpress SARS-CoV-2/FLU/RSV testing.  Fact Sheet for  Patients: EntrepreneurPulse.com.au  Fact Sheet for Healthcare Providers: IncredibleEmployment.be  This test is not yet approved or cleared by the [place] FDA and has been authorized for detection and/or diagnosis of SARS-CoV-2 by FDA under an Emergency Use Authorization (EUA). This EUA will remain in effect (meaning this test can be used) for the duration of the COVID-19 declaration under Section 564(b)(1) of the Act, 21 U.S.C. section 360bbb-3(b)(1), unless the authorization is terminated or revoked.  Performed at [facility], [address] [place], [place] [address]   Culture, blood (routine x 2)     Status: Abnormal   Collection Time: 04/19/22 10:55 AM   Specimen: BLOOD  Result Value Ref Range Status   Specimen Description   Final    BLOOD LEFT ANTECUBITAL Performed at [facility] [place] [facility], [address] [place], [place] [address]    Special Requests   Final    BOTTLES DRAWN AEROBIC AND ANAEROBIC Blood Culture adequate volume Performed at [facility], [address] [place], [place] [address]    Culture  Setup Time   Final    GRAM POSITIVE COCCI IN PAIRS ANAEROBIC BOTTLE ONLY CRITICAL RESULT CALLED TO, READ BACK BY AND VERIFIED WITH: T RUDISILL,[email or web address]  04/20/22 [place] Performed at [facility], [place] [address] [place], [place] [address]    Culture STREPTOCOCCUS PNEUMONIAE (A)  Final   Report Status 04/22/2022 FINAL  Final   Organism ID, Bacteria STREPTOCOCCUS PNEUMONIAE  Final      Susceptibility   Streptococcus pneumoniae - MIC*    ERYTHROMYCIN >=8 RESISTANT Resistant     LEVOFLOXACIN 1 SENSITIVE Sensitive     VANCOMYCIN 0.5 SENSITIVE Sensitive     PENO - penicillin <=0.06      PENICILLIN (non-meningitis) <=0.06 SENSITIVE Sensitive     PENICILLIN (oral) <=0.06 SENSITIVE Sensitive     CEFTRIAXONE (non-meningitis) 0.25 SENSITIVE Sensitive     * STREPTOCOCCUS PNEUMONIAE  Blood Culture ID  Panel (Reflexed)     Status: Abnormal   Collection Time: 04/19/22 10:55 AM  Result Value Ref Range Status   Enterococcus faecalis NOT DETECTED NOT DETECTED Final   Enterococcus Faecium NOT DETECTED NOT DETECTED Final   Listeria monocytogenes NOT DETECTED NOT DETECTED Final   Staphylococcus species NOT DETECTED NOT DETECTED Final   Staphylococcus aureus (BCID) NOT DETECTED NOT DETECTED Final   Staphylococcus epidermidis NOT DETECTED NOT DETECTED Final   Staphylococcus lugdunensis NOT DETECTED NOT DETECTED Final   Streptococcus species DETECTED (A) NOT DETECTED Final    Comment: CRITICAL RESULT CALLED TO, READ BACK BY AND VERIFIED WITH: T RUDISILL,[email or web address]@[email or web address]  04/20/22 [place]    Streptococcus agalactiae NOT DETECTED NOT DETECTED Final   Streptococcus pneumoniae DETECTED (A) NOT DETECTED Final    Comment: CRITICAL RESULT CALLED TO, READ BACK BY AND VERIFIED WITH: T RUDISILL,PHARMD@[id number]  04/20/22 [place]    Streptococcus pyogenes NOT DETECTED NOT DETECTED Final   A.calcoaceticus-baumannii NOT DETECTED NOT DETECTED Final   Bacteroides fragilis NOT DETECTED NOT DETECTED  Final   Enterobacterales NOT DETECTED NOT DETECTED Final   Enterobacter cloacae complex NOT DETECTED NOT DETECTED Final   Escherichia coli NOT DETECTED NOT DETECTED Final   Klebsiella aerogenes NOT DETECTED NOT DETECTED Final   Klebsiella oxytoca NOT DETECTED NOT DETECTED Final   Klebsiella pneumoniae NOT DETECTED NOT DETECTED Final   Proteus species NOT DETECTED NOT DETECTED Final   Salmonella species NOT DETECTED NOT DETECTED Final   Serratia marcescens NOT DETECTED NOT DETECTED Final   Haemophilus influenzae NOT DETECTED NOT DETECTED Final   Neisseria meningitidis NOT DETECTED NOT DETECTED Final   Pseudomonas aeruginosa NOT DETECTED NOT DETECTED Final   Stenotrophomonas maltophilia NOT DETECTED NOT DETECTED Final   Candida albicans NOT DETECTED NOT DETECTED Final   Candida auris NOT DETECTED NOT DETECTED Final   Candida  glabrata NOT DETECTED NOT DETECTED Final   Candida krusei NOT DETECTED NOT DETECTED Final   Candida parapsilosis NOT DETECTED NOT DETECTED Final   Candida tropicalis NOT DETECTED NOT DETECTED Final   Cryptococcus neoformans/gattii NOT DETECTED NOT DETECTED Final    Comment: Performed at [facility], [place] [address], [place], [place] [address]  Culture, blood (routine x 2)     Status: None (Preliminary result)   Collection Time: 04/19/22 10:57 AM   Specimen: BLOOD  Result Value Ref Range Status   Specimen Description   Final    BLOOD RIGHT ANTECUBITAL Performed at [facility], [address] [place], [place] [address]    Special Requests   Final    BOTTLES DRAWN AEROBIC AND ANAEROBIC Blood Culture adequate volume Performed at [facility], [address] [place], [place] [address]    Culture   Final    NO GROWTH 4 DAYS Performed at [facility], [place] [address], [place], [place] [address]    Report Status PENDING  Incomplete  Expectorated Sputum Assessment w Gram Stain, Rflx to Resp Cult     Status: None   Collection Time: 04/19/22 11:08 PM   Specimen: Expectorated Sputum  Result Value Ref Range Status   Specimen Description EXPECTORATED SPUTUM  Final   Special Requests NONE  Final   Sputum evaluation   Final    Sputum specimen not acceptable for testing.  Please recollect.   NOTIFIED HEATHER STANLEY RN ON 04/21/22 @ 2059 BY DRT Performed at [facility], [place] [address], [place], [place] [address]    Report Status 04/21/2022 FINAL  Final  Culture, blood (Routine X 2) w Reflex to ID Panel     Status: None (Preliminary result)   Collection Time: 04/21/22  2:37 PM   Specimen: BLOOD  Result Value Ref Range Status   Specimen Description BLOOD SITE NOT SPECIFIED  Final   Special Requests   Final    BOTTLES DRAWN AEROBIC AND ANAEROBIC Blood Culture adequate volume   Culture   Final    NO GROWTH 2 DAYS Performed at [facility],  [place] [address] [place],  [address]    Report Status PENDING  Incomplete  Culture, blood (Routine X 2) w Reflex to ID Panel     Status: None (Preliminary result)   Collection Time: 04/21/22  2:37 PM   Specimen: BLOOD RIGHT ARM  Result Value Ref Range Status   Specimen Description BLOOD RIGHT ARM  Final   Special Requests   Final    BOTTLES DRAWN AEROBIC AND ANAEROBIC Blood Culture results may not be optimal  due to an inadequate volume of blood received in culture bottles   Culture   Final    NO GROWTH 2 DAYS Performed at [facility], [place] [address] [place], [place] [address]    Report Status PENDING  Incomplete  Expectorated Sputum Assessment w Gram Stain, Rflx to Resp Cult     Status: None   Collection Time: 04/22/22  6:28 AM   Specimen: Expectorated Sputum  Result Value Ref Range Status   Specimen Description EXPECTORATED SPUTUM  Final   Special Requests NONE  Final   Sputum evaluation   Final    Sputum specimen not acceptable for testing.  Please recollect.   CRITICAL RESULT CALLED TO, READ BACK BY AND VERIFIED WITH: 04/22/22 @0927  RN REIVES BY AB Performed at [facility], [place] [address] [place], [place] [address]    Report Status 04/22/2022 FINAL  Final     Labs: BNP (last 3 results) Recent Labs    04/19/22 1035  BNP [id number]*   Basic Metabolic Panel: Recent Labs  Lab 04/19/22 1035 04/20/22 0247 04/21/22 0210 04/22/22 0301 04/23/22 0244  NA 135 137 135 132* 135  K 3.5 3.8 3.2* 3.3* 3.7  CL 106 110 105 104 106  CO2 20* 21* 21* 20* 21*  GLUCOSE 138* 110* 134* 99 98  BUN 15 12 11 8 10   CREATININE 0.94 0.95 0.98 0.88 0.78  CALCIUM 8.8* 8.7* 8.7* 8.1* 8.5*  MG  --   --  1.7  --   --    Liver Function Tests: Recent Labs  Lab 04/19/22 1035  AST 30  ALT 25  ALKPHOS 84  BILITOT 0.6  PROT 6.5  ALBUMIN 3.0*   Recent Labs  Lab 04/19/22 1035  LIPASE 32   No results for input(s): "AMMONIA" in the last 168 hours. CBC: Recent Labs  Lab  04/19/22 1035 04/20/22 0247 04/21/22 0210 04/22/22 0301  WBC 23.1* 15.3* 7.3 6.7  HGB 13.6 11.6* 13.1 12.5*  HCT 39.5 33.7* 37.5* 35.0*  MCV 88.6 89.4 89.3 87.3  PLT 200 175 214 197   Cardiac Enzymes: No results for input(s): "CKTOTAL", "CKMB", "CKMBINDEX", "TROPONINI" in the last 168 hours. BNP: Invalid input(s): "POCBNP" CBG: No results for input(s): "GLUCAP" in the last 168 hours. D-Dimer No results for input(s): "DDIMER" in the last 72 hours. Hgb A1c No results for input(s): "HGBA1C" in the last 72 hours. Lipid Profile No results for input(s): "CHOL", "HDL", "LDLCALC", "TRIG", "CHOLHDL", "LDLDIRECT" in the last 72 hours. Thyroid function studies Recent Labs    04/21/22 0210  TSH 3.039   Anemia work up No results for input(s): "VITAMINB12", "FOLATE", "FERRITIN", "TIBC", "IRON", "RETICCTPCT" in the last 72 hours. Urinalysis    Component Value Date/Time   COLORURINE YELLOW 04/19/2022 1335   APPEARANCEUR CLOUDY (A) 04/19/2022 1335   LABSPEC 1.020 04/19/2022 1335   PHURINE 7.0 04/19/2022 1335   GLUCOSEU NEGATIVE 04/19/2022 1335   HGBUR SMALL (A) 04/19/2022 1335   BILIRUBINUR NEGATIVE 04/19/2022 1335   KETONESUR NEGATIVE 04/19/2022 1335   PROTEINUR >=300 (A) 04/19/2022 1335   NITRITE NEGATIVE 04/19/2022 1335   LEUKOCYTESUR NEGATIVE 04/19/2022 1335   Sepsis Labs Recent Labs  Lab 04/19/22 1035 04/20/22 0247 04/21/22 0210 04/22/22 0301  WBC 23.1* 15.3* 7.3 6.7   Microbiology Recent Results (from the past 240 hour(s))  Resp Panel by RT-PCR (Flu A&B, Covid) Anterior Nasal Swab     Status: None   Collection Time: 04/19/22 10:36 AM   Specimen:  Anterior Nasal Swab  Result Value Ref Range Status   SARS Coronavirus 2 by RT PCR NEGATIVE NEGATIVE Final    Comment: (NOTE) SARS-CoV-2 target nucleic acids are NOT DETECTED.  The SARS-CoV-2 RNA is generally detectable in upper respiratory specimens during the acute phase of infection. The lowest concentration of  SARS-CoV-2 viral copies this assay can detect is 138 copies/mL. A negative result does not preclude SARS-Cov-2 infection and should not be used as the sole basis for treatment or other patient management decisions. A negative result may occur with  improper specimen collection/handling, submission of specimen other than nasopharyngeal swab, presence of viral mutation(s) within the areas targeted by this assay, and inadequate number of viral copies(<138 copies/mL). A negative result must be combined with clinical observations, patient history, and epidemiological information. The expected result is Negative.  Fact Sheet for Patients:  EntrepreneurPulse.com.au  Fact Sheet for Healthcare Providers:  IncredibleEmployment.be  This test is no t yet approved or cleared by the [place] FDA and  has been authorized for detection and/or diagnosis of SARS-CoV-2 by FDA under an Emergency Use Authorization (EUA). This EUA will remain  in effect (meaning this test can be used) for the duration of the COVID-19 declaration under Section 564(b)(1) of the Act, 21 U.S.C.section 360bbb-3(b)(1), unless the authorization is terminated  or revoked sooner.       Influenza A by PCR NEGATIVE NEGATIVE Final   Influenza B by PCR NEGATIVE NEGATIVE Final    Comment: (NOTE) The Xpert Xpress SARS-CoV-2/FLU/RSV plus assay is intended as an aid in the diagnosis of influenza from Nasopharyngeal swab specimens and should not be used as a sole basis for treatment. Nasal washings and aspirates are unacceptable for Xpert Xpress SARS-CoV-2/FLU/RSV testing.  Fact Sheet for Patients: EntrepreneurPulse.com.au  Fact Sheet for Healthcare Providers: IncredibleEmployment.be  This test is not yet approved or cleared by the [place] FDA and has been authorized for detection and/or diagnosis of SARS-CoV-2 by FDA under an Emergency Use  Authorization (EUA). This EUA will remain in effect (meaning this test can be used) for the duration of the COVID-19 declaration under Section 564(b)(1) of the Act, 21 U.S.C. section 360bbb-3(b)(1), unless the authorization is terminated or revoked.  Performed at [facility], [address] [place], [place] [address]   Culture, blood (routine x 2)     Status: Abnormal   Collection Time: 04/19/22 10:55 AM   Specimen: BLOOD  Result Value Ref Range Status   Specimen Description   Final    BLOOD LEFT ANTECUBITAL Performed at [facility], [address] [place], [place] [address]    Special Requests   Final    BOTTLES DRAWN AEROBIC AND ANAEROBIC Blood Culture adequate volume Performed at [facility] [place] [facility], [address] [place], [place] [address]    Culture  Setup Time   Final    GRAM POSITIVE COCCI IN PAIRS ANAEROBIC BOTTLE ONLY CRITICAL RESULT CALLED TO, READ BACK BY AND VERIFIED WITH: T RUDISILL,PHARMD@[id number]  04/20/22 [place] Performed at [facility], [place] [address] [place], [place] [address]    Culture STREPTOCOCCUS PNEUMONIAE (A)  Final   Report Status 04/22/2022 FINAL  Final   Organism ID, Bacteria STREPTOCOCCUS PNEUMONIAE  Final      Susceptibility   Streptococcus pneumoniae - MIC*    ERYTHROMYCIN >=8 RESISTANT Resistant     LEVOFLOXACIN 1 SENSITIVE Sensitive     VANCOMYCIN 0.5 SENSITIVE Sensitive     PENO -  penicillin <=0.06      PENICILLIN (non-meningitis) <=0.06 SENSITIVE Sensitive     PENICILLIN (oral) <=0.06 SENSITIVE Sensitive     CEFTRIAXONE (non-meningitis) 0.25 SENSITIVE Sensitive     * STREPTOCOCCUS PNEUMONIAE  Blood Culture ID Panel (Reflexed)     Status: Abnormal   Collection Time: 04/19/22 10:55 AM  Result Value Ref Range Status   Enterococcus faecalis NOT DETECTED NOT DETECTED Final   Enterococcus Faecium NOT DETECTED NOT DETECTED Final   Listeria monocytogenes NOT DETECTED NOT DETECTED Final   Staphylococcus species NOT  DETECTED NOT DETECTED Final   Staphylococcus aureus (BCID) NOT DETECTED NOT DETECTED Final   Staphylococcus epidermidis NOT DETECTED NOT DETECTED Final   Staphylococcus lugdunensis NOT DETECTED NOT DETECTED Final   Streptococcus species DETECTED (A) NOT DETECTED Final    Comment: CRITICAL RESULT CALLED TO, READ BACK BY AND VERIFIED WITH: T RUDISILL,PHARMD@0537  04/20/22 [place]    Streptococcus agalactiae NOT DETECTED NOT DETECTED Final   Streptococcus pneumoniae DETECTED (A) NOT DETECTED Final    Comment: CRITICAL RESULT CALLED TO, READ BACK BY AND VERIFIED WITH: T RUDISILL,PHARMD@0537  04/20/22 [place]    Streptococcus pyogenes NOT DETECTED NOT DETECTED Final   A.calcoaceticus-baumannii NOT DETECTED NOT DETECTED Final   Bacteroides fragilis NOT DETECTED NOT DETECTED Final   Enterobacterales NOT DETECTED NOT DETECTED Final   Enterobacter cloacae complex NOT DETECTED NOT DETECTED Final   Escherichia coli NOT DETECTED NOT DETECTED Final   Klebsiella aerogenes NOT DETECTED NOT DETECTED Final   Klebsiella oxytoca NOT DETECTED NOT DETECTED Final   Klebsiella pneumoniae NOT DETECTED NOT DETECTED Final   Proteus species NOT DETECTED NOT DETECTED Final   Salmonella species NOT DETECTED NOT DETECTED Final   Serratia marcescens NOT DETECTED NOT DETECTED Final   Haemophilus influenzae NOT DETECTED NOT DETECTED Final   Neisseria meningitidis NOT DETECTED NOT DETECTED Final   Pseudomonas aeruginosa NOT DETECTED NOT DETECTED Final   Stenotrophomonas maltophilia NOT DETECTED NOT DETECTED Final   Candida albicans NOT DETECTED NOT DETECTED Final   Candida auris NOT DETECTED NOT DETECTED Final   Candida glabrata NOT DETECTED NOT DETECTED Final   Candida krusei NOT DETECTED NOT DETECTED Final   Candida parapsilosis NOT DETECTED NOT DETECTED Final   Candida tropicalis NOT DETECTED NOT DETECTED Final   Cryptococcus neoformans/gattii NOT DETECTED NOT DETECTED Final    Comment: Performed at [place], [place], [place], [place] [address]  Culture, blood (routine x 2)     Status: None (Preliminary result)   Collection Time: 04/19/22 10:57 AM   Specimen: BLOOD  Result Value Ref Range Status   Specimen Description   Final    BLOOD RIGHT ANTECUBITAL Performed at [facility], [address] [place], [place] [address]    Special Requests   Final    BOTTLES DRAWN AEROBIC AND ANAEROBIC Blood Culture adequate volume Performed at [facility], [address] [place], [place] [address]    Culture   Final    NO GROWTH 4 DAYS Performed at [facility], [place] [address] [place], [place] [address]    Report Status PENDING  Incomplete  Expectorated Sputum Assessment w Gram Stain, Rflx to Resp Cult     Status: None   Collection Time: 04/19/22 11:08 PM   Specimen: Expectorated Sputum  Result Value Ref Range Status   Specimen Description EXPECTORATED SPUTUM  Final   Special Requests NONE  Final   Sputum evaluation   Final  Sputum specimen not acceptable for testing.  Please recollect.   NOTIFIED HEATHER STANLEY RN ON 04/21/22 @ 2059 BY DRT Performed at [facility], [place] [address], [place], [place] [address]    Report Status 04/21/2022 FINAL  Final  Culture, blood (Routine X 2) w Reflex to ID Panel     Status: None (Preliminary result)   Collection Time: 04/21/22  2:37 PM   Specimen: BLOOD  Result Value Ref Range Status   Specimen Description BLOOD SITE NOT SPECIFIED  Final   Special Requests   Final    BOTTLES DRAWN AEROBIC AND ANAEROBIC Blood Culture adequate volume   Culture   Final    NO GROWTH 2 DAYS Performed at [facility], [place] [address], [place], [place] [address]    Report Status PENDING  Incomplete  Culture, blood (Routine X 2) w Reflex to ID Panel     Status: None (Preliminary result)   Collection Time: 04/21/22  2:37 PM   Specimen: BLOOD RIGHT ARM  Result Value Ref Range Status   Specimen Description BLOOD RIGHT ARM  Final    Special Requests   Final    BOTTLES DRAWN AEROBIC AND ANAEROBIC Blood Culture results may not be optimal due to an inadequate volume of blood received in culture bottles   Culture   Final    NO GROWTH 2 DAYS Performed at [facility], [place] [address] [place], [place] [address]    Report Status PENDING  Incomplete  Expectorated Sputum Assessment w Gram Stain, Rflx to Resp Cult     Status: None   Collection Time: 04/22/22  6:28 AM   Specimen: Expectorated Sputum  Result Value Ref Range Status   Specimen Description EXPECTORATED SPUTUM  Final   Special Requests NONE  Final   Sputum evaluation   Final    Sputum specimen not acceptable for testing.  Please recollect.   CRITICAL RESULT CALLED TO, READ BACK BY AND VERIFIED WITH: 04/22/22 @0927  RN REIVES BY AB Performed at [facility], [place] [address], [place], [place] [address]    Report Status 04/22/2022 FINAL  Final    Please note: You were cared for by a hospitalist during your hospital stay. Once you are discharged, your primary care physician will handle any further medical issues. Please note that NO REFILLS for any discharge medications will be authorized once you are discharged, as it is imperative that you return to your primary care physician (or establish a relationship with a primary care physician if you do not have one) for your post hospital discharge needs so that they can reassess your need for medications and monitor your lab values.    Time coordinating discharge: 40 minutes  SIGNED:   Shelly Coss, MD  [facility] 04/23/2022, 11:24 AM Pager [phone]  If 7PM-7AM, please contact night-coverage [email or web address] Password [email or web address]

## 2022-04-24 LAB — CULTURE, BLOOD (ROUTINE X 2)
Culture: NO GROWTH
Special Requests: ADEQUATE

## 2022-04-26 LAB — CULTURE, BLOOD (ROUTINE X 2)
Culture: NO GROWTH
Culture: NO GROWTH
Special Requests: ADEQUATE

## 2022-05-12 ENCOUNTER — Telehealth: Payer: Self-pay

## 2022-05-13 NOTE — Telephone Encounter (Signed)
Error

## 2022-05-13 NOTE — Telephone Encounter (Signed)
ATC no voicemail. Letter generated in Epic to contact office for hospital h/f. Will await pt response.

## 2022-05-18 ENCOUNTER — Encounter: Payer: Self-pay | Admitting: Pulmonary Disease

## 2022-05-18 ENCOUNTER — Ambulatory Visit (INDEPENDENT_AMBULATORY_CARE_PROVIDER_SITE_OTHER): Payer: Self-pay | Admitting: Pulmonary Disease

## 2022-05-18 VITALS — BP 114/68 | HR 78 | Ht 63.0 in | Wt 168.6 lb

## 2022-05-18 DIAGNOSIS — J189 Pneumonia, unspecified organism: Secondary | ICD-10-CM

## 2022-05-18 DIAGNOSIS — R911 Solitary pulmonary nodule: Secondary | ICD-10-CM

## 2022-05-18 DIAGNOSIS — R0609 Other forms of dyspnea: Secondary | ICD-10-CM

## 2022-05-18 DIAGNOSIS — F17210 Nicotine dependence, cigarettes, uncomplicated: Secondary | ICD-10-CM

## 2022-05-18 NOTE — Patient Instructions (Signed)
Nice to meet you  I am glad you are feeling better, that is the most important thing  I think the findings on your CT scan are representative of a pneumonia.  Since you are feeling better I think the antibiotics have treated this effectively.  No role for additional antibiotics at this time.  I am ordering a CT scan to be performed in the next several days to make sure that the pneumonia is improving.  It does not have to be totally gone on the pictures but it needs to be shrinking and improving.  That will be reassuring.  We will also take a look at the nodules or spots that were seen on the lungs while you are in the hospital.  We will get pulmonary function test when you return for your follow-up visit.  Return to clinic in 2 months with pulmonary function test same day as visit with Dr. Silas Flood

## 2022-05-19 NOTE — Progress Notes (Signed)
@Patient  ID: Scott Carrillo, male    DOB: 1976-06-06, 46 y.o.   MRN: [id number]  Chief Complaint  Patient presents with   Consult    Pt is here for consult. Pt states he is unsure what he is being seen for. But he states that he has a spot noted on his lungs. He was seen in hospital for pna. No inhalers noted at this time     Referring provider: No ref. provider found  HPI:   46 y.o. man whom are seen in hospital follow-up for evaluation of dense right middle lobe consolidation concerning for pneumonia with signs of early cavitation.  Discharge summary reviewed most recent pulmonary consult note in the hospital reviewed.  Patient endorses preceding symptoms about 1 week of fatigue.  Then he developed right-sided chest pain radiating to upper back.  Developed shortness of breath.  Worsening cough from baseline.  Suddenly developed fever and chills prompting presentation to the ED.  Chest x-ray on my review interpretation shows dense right-sided infiltrate.  CT scan was obtained that demonstrates complete opacification of the right middle lobe with early signs of cavitation on my review and interpretation as well as scattered nodular opacities bilaterally.  Reviewed labs and the case strep urinary antigen positive.  Also had strep pneumonia bacteremia.  For some reason was felt to be contaminant in the discharge summary.  I am not clear why this is the case given he had clear evidence of pneumonia, strep pneumonia in the blood would make sense, as well as he had strep pneumo antigen positive urine test.  Most likely completed on 3 days of IV antibiotics with transition to Levaquin for additional 5 days or so.  8 to 9 days of antibiotics.  He had persistent cough for a couple weeks afterwards.  Pretty bad.  Subsequent resolved.  Now much better and not an issue.  Slowly getting strength back.  PMH: Tobacco abuse Surgical history: Wisdom tooth extraction Family history:History reviewed. No  pertinent family history. Social history: Current smoker, pack a day, [place] to [place]    Questionaires / Pulmonary Flowsheets:   ACT:      No data to display          MMRC:     No data to display          Epworth:      No data to display          Tests:   FENO:  No results found for: "NITRICOXIDE"  PFT:     No data to display          WALK:      No data to display          Imaging: Personally reviewed and as per EMR discussion this note ECHOCARDIOGRAM COMPLETE  Result Date: 04/21/2022    ECHOCARDIOGRAM REPORT   Patient Name:   Scott Carrillo Date of Exam: 04/21/2022 Medical Rec #:  [id number]    Height:       64.0 in Accession #:    [id number]   Weight:       173.0 lb Date of Birth:  Sep 04, 1975   BSA:          1.839 m Patient Age:    46 years     BP:           112/80 mmHg Patient Gender: M            HR:  96 bpm. Exam Location:  Inpatient Procedure: 2D Echo Indications:    abnormal ecg  History:        Patient has no prior history of Echocardiogram examinations.                 Signs/Symptoms:Chest Pain and Shortness of Breath; Risk                 Factors:Current Smoker.  Sonographer:    Harvie Junior Referring Phys: Tawanna Solo, AMRIT IMPRESSIONS  1. Left ventricular ejection fraction, by estimation, is 55%. The left ventricle has normal function. The left ventricle has no regional wall motion abnormalities but there was septal bounce suggestive of interventricular conduction delay. Left ventricular diastolic parameters were normal.  2. Right ventricular systolic function is normal. The right ventricular size is normal. Tricuspid regurgitation signal is inadequate for assessing PA pressure.  3. The mitral valve is normal in structure. No evidence of mitral valve regurgitation. No evidence of mitral stenosis.  4. The aortic valve is tricuspid. Aortic valve regurgitation is not visualized. No aortic stenosis is present.  5. The inferior  vena cava is normal in size with greater than 50% respiratory variability, suggesting right atrial pressure of 3 mmHg. FINDINGS  Left Ventricle: Left ventricular ejection fraction, by estimation, is 55%. The left ventricle has normal function. The left ventricle has no regional wall motion abnormalities. The left ventricular internal cavity size was normal in size. There is no left ventricular hypertrophy. Left ventricular diastolic parameters were normal. Right Ventricle: The right ventricular size is normal. No increase in right ventricular wall thickness. Right ventricular systolic function is normal. Tricuspid regurgitation signal is inadequate for assessing PA pressure. The tricuspid regurgitant velocity is 1.35 m/s, and with an assumed right atrial pressure of 3 mmHg, the estimated right ventricular systolic pressure is 123456 mmHg. Left Atrium: Left atrial size was normal in size. Right Atrium: Right atrial size was normal in size. Pericardium: There is no evidence of pericardial effusion. Mitral Valve: The mitral valve is normal in structure. No evidence of mitral valve regurgitation. No evidence of mitral valve stenosis. Tricuspid Valve: The tricuspid valve is normal in structure. Tricuspid valve regurgitation is trivial. Aortic Valve: The aortic valve is tricuspid. Aortic valve regurgitation is not visualized. No aortic stenosis is present. Pulmonic Valve: The pulmonic valve was normal in structure. Pulmonic valve regurgitation is not visualized. Aorta: The aortic root is normal in size and structure. Venous: The inferior vena cava is normal in size with greater than 50% respiratory variability, suggesting right atrial pressure of 3 mmHg. IAS/Shunts: No atrial level shunt detected by color flow Doppler.  LEFT VENTRICLE PLAX 2D LVIDd:         5.20 cm      Diastology LVIDs:         3.60 cm      LV e' medial:    8.59 cm/s LV PW:         0.80 cm      LV E/e' medial:  10.0 LV IVS:        0.80 cm      LV e'  lateral:   15.00 cm/s LVOT diam:     2.00 cm      LV E/e' lateral: 5.7 LV SV:         57 LV SV Index:   31 LVOT Area:     3.14 cm  LV Volumes (MOD) LV vol d, MOD A2C: 99.7 ml LV vol d,  MOD A4C: 130.0 ml LV vol s, MOD A2C: 39.4 ml LV vol s, MOD A4C: 59.5 ml LV SV MOD A2C:     60.3 ml LV SV MOD A4C:     130.0 ml LV SV MOD BP:      65.7 ml RIGHT VENTRICLE RV Basal diam:  3.50 cm RV Mid diam:    2.40 cm RV S prime:     9.25 cm/s TAPSE (M-mode): 1.9 cm LEFT ATRIUM           Index        RIGHT ATRIUM           Index LA diam:      2.90 cm 1.58 cm/m   RA Area:     15.00 cm LA Vol (A4C): 46.1 ml 25.06 ml/m  RA Volume:   36.80 ml  20.01 ml/m  AORTIC VALVE             PULMONIC VALVE LVOT Vmax:   121.00 cm/s PV Vmax:       1.08 m/s LVOT Vmean:  77.600 cm/s PV Peak grad:  4.7 mmHg LVOT VTI:    0.182 m  AORTA Ao Root diam: 3.00 cm MITRAL VALVE               TRICUSPID VALVE MV Area (PHT): 5.13 cm    TR Peak grad:   7.3 mmHg MV Decel Time: 148 msec    TR Vmax:        135.00 cm/s MV E velocity: 85.90 cm/s MV A velocity: 62.90 cm/s  SHUNTS MV E/A ratio:  1.37        Systemic VTI:  0.18 m                            Systemic Diam: 2.00 cm Dalton McleanMD Electronically signed by Franki Monte Signature Date/Time: 04/21/2022/4:36:00 PM    Final    US Venous Img Upper Left (DVT Study)  Result Date: 04/19/2022 CLINICAL DATA:  Possible thrombus seen on recent CT imaging EXAM: LEFT UPPER EXTREMITY VENOUS DOPPLER ULTRASOUND TECHNIQUE: Gray-scale sonography with graded compression, as well as color Doppler and duplex ultrasound were performed to evaluate the upper extremity deep venous system from the level of the subclavian vein and including the jugular, axillary, basilic, radial, ulnar and upper cephalic vein. Spectral Doppler was utilized to evaluate flow at rest and with distal augmentation maneuvers. COMPARISON:  None Available. FINDINGS: Contralateral Subclavian Vein: Respiratory phasicity is normal and symmetric with the  symptomatic side. No evidence of thrombus. Normal compressibility. Internal Jugular Vein: No evidence of thrombus. Normal compressibility, respiratory phasicity and response to augmentation. Subclavian Vein: No evidence of thrombus. Normal compressibility, respiratory phasicity and response to augmentation. Axillary Vein: No evidence of thrombus. Normal compressibility, respiratory phasicity and response to augmentation. Cephalic Vein: No evidence of thrombus. Normal compressibility, respiratory phasicity and response to augmentation. Basilic Vein: No evidence of thrombus. Normal compressibility, respiratory phasicity and response to augmentation. Brachial Veins: No evidence of thrombus. Normal compressibility, respiratory phasicity and response to augmentation. Radial Veins: No evidence of thrombus. Normal compressibility, respiratory phasicity and response to augmentation. Ulnar Veins: No evidence of thrombus. Normal compressibility, respiratory phasicity and response to augmentation. Venous Reflux:  None visualized. Other Findings:  None visualized. IMPRESSION: No evidence of DVT within the left upper extremity. Electronically Signed   By: Jacqulynn Cadet M.D.   On: 04/19/2022 14:50   CT Angio Chest/Abd/Pel for  Dissection W and/or W/WO  Result Date: 04/19/2022 CLINICAL DATA:  Acute aortic syndrome suspected. Chest pain, back pain, cough. Nausea, headache, shortness of breath. The EXAM: CT ANGIOGRAPHY CHEST, ABDOMEN AND PELVIS TECHNIQUE: Non-contrast CT of the chest was initially obtained. Multidetector CT imaging through the chest, abdomen and pelvis was performed using the standard protocol during bolus administration of intravenous contrast. Multiplanar reconstructed images and MIPs were obtained and reviewed to evaluate the vascular anatomy. RADIATION DOSE REDUCTION: This exam was performed according to the departmental dose-optimization program which includes automated exposure control, adjustment of  the mA and/or kV according to patient size and/or use of iterative reconstruction technique. CONTRAST:  184mL OMNIPAQUE IOHEXOL 350 MG/ML SOLN COMPARISON:  Chest x-ray on 04/19/2022 FINDINGS: CTA CHEST FINDINGS Cardiovascular: Heart is normal in size. No pericardial effusion. No evidence for aortic dissection or aneurysm. The pulmonary arteries are normal in appearance accounting for contrast bolus timing. Multiple collaterals are identified in the LEFT shoulder region. There is possible thrombus within the LOWER LEFT internal jugular vein (6:1). Possible thrombus versus mixing of opacified and non-opacified blood within the LEFT subclavian/superior vena cava. Recommend further evaluation with LEFT UPPER extremity venous doppler. Mediastinum/Nodes: Multiple thyroid nodules are identified. Within the isthmus, the largest is 1.7 centimeters (6:10). Additional thyroid nodules are identified in the LEFT lobe. Mediastinal adenopathy noted, largest RIGHT paratracheal lymph node measures 1.2 centimeters. There is confluent opacity in the RIGHT hilar region, precluding evaluation for discrete lymph nodes. However, adenopathy in this region is suspected. No significant LEFT hilar adenopathy. Lungs/Pleura: There is masslike consolidation of the RIGHT UPPER lobe and RIGHT middle lobe, with early cavitation noted in these regions. The involved area is estimated to measure 9.1 x 10.1 x 5.4 centimeters. Consolidation and/or atelectasis present within the RIGHT LOWER lobe. Ground-glass pulmonary nodule in the RIGHT LOWER lobe is 5 millimeters on image 7:26. LEFT lung is clear. No pleural effusions. Musculoskeletal: No chest wall abnormality. No acute or significant osseous findings. Review of the MIP images confirms the above findings. CTA ABDOMEN AND PELVIS FINDINGS VASCULAR Aorta: Normal caliber aorta without aneurysm, dissection, vasculitis or significant stenosis. Celiac: Patent without evidence of aneurysm, dissection,  vasculitis or significant stenosis. SMA: Patent without evidence of aneurysm, dissection, vasculitis or significant stenosis. Renals: Both renal arteries are patent without evidence of aneurysm, dissection, vasculitis, fibromuscular dysplasia or significant stenosis. IMA: Patent without evidence of aneurysm, dissection, vasculitis or significant stenosis. Inflow: Patent without evidence of aneurysm, dissection, vasculitis or significant stenosis. Veins: No obvious venous abnormality within the limitations of this arterial phase study. Review of the MIP images confirms the above findings. NON-VASCULAR Hepatobiliary: No focal liver abnormality is seen. No gallstones, gallbladder wall thickening, or biliary dilatation. Pancreas: Unremarkable. No pancreatic ductal dilatation or surrounding inflammatory changes. Spleen: Normal in size without focal abnormality. Adrenals/Urinary Tract: Normal adrenal glands. Bilateral renal cysts, largest in the RIGHT kidney measuring 2.1 centimeters. No further imaging is recommended. No hydronephrosis. Ureters are unremarkable. The bladder and visualized portion of the urethra are normal. Stomach and small bowel loops are normal in appearance. The appendix is well seen and normal in appearance. Colon is unremarkable. Lymphatic: Unremarkable. Reproductive: Prostatic calcifications noted. Other: No ascites.  Small fat containing bilateral inguinal hernias. Musculoskeletal: No acute or significant osseous findings. Review of the MIP images confirms the above findings. IMPRESSION: 1. Masslike consolidation of the RIGHT UPPER lobe and RIGHT middle lobe, with early cavitation present. The involved area is estimated to measure 9.1 x 10.1 x  5.4 centimeters. Although the findings are suspicious for malignancy, the rapid change since prior chest x-ray performed on 09/08/2021 suggest infectious/inflammatory causes. Necrotizing infection should be excluded. 2. Discrete 5 millimeter ground-glass  pulmonary nodule in the RIGHT LOWER lobe. 3. Mediastinal and RIGHT hilar adenopathy. 4. No evidence for aortic dissection or aneurysm. 5. Possible thrombus within the LEFT internal jugular, subclavian vein, and superior vena cava. Recommend further evaluation with LEFT UPPER extremity venous Doppler exam. 6. Bilateral renal cysts. No follow-up imaging is recommended. JACR 2018 Feb; 264-273, Management of the Incidental RenalMass on CT, RadioGraphics 2021; 814-848, Bosniak Classification of Cystic Renal Masses, Version 2019. 7. Normal appendix. 8. Small fat containing bilateral inguinal hernias. 9. Multiple thyroid nodules, the largest measuring 1.7 centimeters. Recommend non-emergent thyroid ultrasound. Reference: J Am Coll Radiol. 2015 Feb;12(2): 143-50 Electronically Signed   By: Nolon Nations M.D.   On: 04/19/2022 13:42   DG Chest Portable 1 View  Result Date: 04/19/2022 CLINICAL DATA:  Shortness of breath with cough and headache. EXAM: PORTABLE CHEST 1 VIEW COMPARISON:  Chest radiograph 09/07/2018 FINDINGS: The cardiomediastinal silhouette is normal. There is confluent opacity projecting over the right lower lobe. The right upper lobe is well-aerated. The left lung is clear. There is a small right pleural effusion. There is no significant left effusion. There is no pneumothorax There is no acute osseous abnormality. IMPRESSION: Confluent right lower lobe opacity raising concern for pneumonia with small parapneumonic effusion. Recommend follow-up radiographs in 3-4 weeks to ensure resolution. Electronically Signed   By: Valetta Mole M.D.   On: 04/19/2022 11:13    Lab Results:  CBC    Component Value Date/Time   WBC 6.7 04/22/2022 0301   RBC 4.01 (L) 04/22/2022 0301   HGB 12.5 (L) 04/22/2022 0301   HCT 35.0 (L) 04/22/2022 0301   PLT 197 04/22/2022 0301   MCV 87.3 04/22/2022 0301   MCH 31.2 04/22/2022 0301   MCHC 35.7 04/22/2022 0301   RDW 13.8 04/22/2022 0301   LYMPHSABS 3.4 09/08/2021 2105    MONOABS 0.6 09/08/2021 2105   EOSABS 0.1 09/08/2021 2105   BASOSABS 0.1 09/08/2021 2105    BMET    Component Value Date/Time   NA 135 04/23/2022 0244   K 3.7 04/23/2022 0244   CL 106 04/23/2022 0244   CO2 21 (L) 04/23/2022 0244   GLUCOSE 98 04/23/2022 0244   BUN 10 04/23/2022 0244   CREATININE 0.78 04/23/2022 0244   CALCIUM 8.5 (L) 04/23/2022 0244   GFRNONAA >60 04/23/2022 0244   GFRAA >60 07/21/2017 1144    BNP    Component Value Date/Time   BNP 165.3 (H) 04/19/2022 1035    ProBNP No results found for: "PROBNP"  Specialty Problems       Pulmonary Problems   Lung nodule   Sepsis due to right upper and middle lobe cavitary pneumonia (HCC)   Pneumonia of right middle lobe due to infectious organism    Allergies  Allergen Reactions   Chocolate Anaphylaxis   Penicillins Anaphylaxis    Pt reported hives + throat swelling to PCN as child s/p wisdom teeth removal. No rxn noted per pt when received CTX in 2017.    Bee Venom Hives     There is no immunization history on file for this patient.  History reviewed. No pertinent past medical history.  Tobacco History: Social History   Tobacco Use  Smoking Status Every Day   Packs/day: 1.00   Types: Cigarettes  Smokeless Tobacco Never  Ready to quit: Not Answered Counseling given: Not Answered   Continue to not smoke  Outpatient Encounter Medications as of 05/18/2022  Medication Sig   Menthol, Topical Analgesic, (ICY HOT ORIGINAL PAIN RELIEF EX) Apply 1 application  topically 2 (two) times daily as needed (pain). Icy Hot roll on   metoprolol succinate (TOPROL-XL) 25 MG 24 hr tablet Take 0.5 tablets (12.5 mg total) by mouth daily.   naproxen sodium (ALEVE) 220 MG tablet Take 660 mg by mouth daily as needed (pain).   oxyCODONE-acetaminophen (PERCOCET) 5-325 MG tablet Take 1-2 tablets by mouth every 4 (four) hours as needed. (Patient taking differently: Take 1 tablet by mouth daily as needed (pain).)   No  facility-administered encounter medications on file as of 05/18/2022.     Review of Systems  Review of Systems  No chest pain exertion or inhalation.  No orthopnea or PND.  Comprehensive review of systems otherwise negative. Physical Exam  BP 114/68 (BP Location: Left Arm, Patient Position: Sitting, Cuff Size: Normal)   Pulse 78   Ht 5\' 3"  (1.6 m)   Wt 168 lb 9.6 oz (76.5 kg)   SpO2 97%   BMI 29.87 kg/m   Wt Readings from Last 5 Encounters:  05/18/22 168 lb 9.6 oz (76.5 kg)  04/19/22 173 lb (78.5 kg)  09/08/21 187 lb 6.3 oz (85 kg)  03/20/21 177 lb (80.3 kg)  07/21/17 160 lb (72.6 kg)    BMI Readings from Last 5 Encounters:  05/18/22 29.87 kg/m  04/19/22 29.70 kg/m  09/08/21 33.19 kg/m  03/20/21 31.35 kg/m  07/21/17 28.34 kg/m     Physical Exam General: Sitting in chair, no acute distress Eyes: EOMI, no icterus Neck: Supple, no JVP Pulmonary: Distant, clear, normal work of breathing Cardiovascular: Warm, no edema Abdomen: No sacral vessels present MSK: No synovitis, no joint effusion Neuro: Normal gait, no weakness Psych: Normal, full affect   Assessment & Plan:   Dense right middle lobe pneumonia with evidence of early cavitation: Strep pneumonia in the blood.  Likely strep pneumo.  Strep urinary engine also positive.  Completed about 8 to 9 days of levofloxacin.  Ideally with early cavitation personally with treated for a more prolonged course.  However he had significant symptomatic improvement in the interval.  This is encouraging that this is more representative of acute pneumonia versus other more malicious process such as malignancy.  Repeat CT scan in the coming weeks at about 4 to 6-week interval to ensure improvement.  Additional scans pending results.  Tobacco abuse: At risk for smoking-related lung disease.  Pulmonary function test at time of next visit for further evaluation.  He does endorse mild dyspnea on exertion  Smoking assessment and  cessation counseling I have advised the patient to quit/stop smoking as soon as possible due to high risk for multiple medical problems.  It will also be very difficult for [place] to manage patient's  respiratory symptoms and status if we continue to expose her lungs to a known irritant.  We do not advise e-cigarettes as a form of stopping smoking. Patient is willing to quit smoking. I have advised the patient that we can assist and have options of nicotine replacement therapy, provided smoking cessation education today, provided smoking cessation counseling, and provided cessation resources. Follow-up next office visit office visit for assessment of smoking cessation.  Total of 5 minutes was spent in smoking cessation counseling.    Return in about 2 months (around 07/18/2022).   Bonna Gains  Shannel Zahm, MD   05/19/2022   This appointment required 63 minutes of patient care (this includes precharting, chart review, review of results, face-to-face care, etc.).

## 2022-05-23 NOTE — Progress Notes (Deleted)
Cardiology Office Note:    Date:  05/23/2022   ID:  Scott Carrillo, DOB [birth year], MRN [id number]  PCP:  Pcp, No   [facility] Providers Cardiologist:  None { Click to update primary MD,subspecialty MD or APP then REFRESH:1}    Referring MD: No ref. provider found   No chief complaint on file. ***  History of Present Illness:    Scott Carrillo is a 46 y.o. male with a hx of ***  Tobacco abuse  Presented to the ED on April 19, 2022 with chief complaint of cough, chest pain, back pain, and congestion.  CTA of chest revealed right upper lobe, right middle lobe masslike consolidation with early cavitation possible thrombus in the left IJ, left subclavian, SVC.  5 mm lung nodule in right lower lobe, mediastinal, right hilar adenopathy.  Urine antigen positive for strep pneumonia.  1 blood culture positive for strep pneumonia.  Hospital course complicated with new onset A-fib, started on amiodarone drip, cardiology consulted, now resolved.  Echo revealed EF of 55%, no RWMA.  Discharged in stable condition with oral antibiotics.  Today he presents for follow-up for evaluation of history of new onset A-fib.  No past medical history on file.  Past Surgical History:  Procedure Laterality Date   WISDOM TOOTH EXTRACTION      Current Medications: No outpatient medications have been marked as taking for the 05/24/22 encounter (Appointment) with Richardson Dopp T, PA-C.     Allergies:   Chocolate, Penicillins, and Bee venom   Social History   Socioeconomic History   Marital status: Single    Spouse name: Not on file   Number of children: Not on file   Years of education: Not on file   Highest education level: Not on file  Occupational History   Not on file  Tobacco Use   Smoking status: Every Day    Packs/day: 1.00    Types: Cigarettes   Smokeless tobacco: Never  Substance and Sexual Activity   Alcohol use: No   Drug use: Yes    Types: Marijuana    Comment: rare    Sexual activity: Not on file  Other Topics Concern   Not on file  Social History Narrative   Not on file   Social Determinants of Health   Financial Resource Strain: Not on file  Food Insecurity: No Food Insecurity (04/20/2022)   Hunger Vital Sign    Worried About Running Out of Food in the Last Year: Never true    Ran Out of Food in the Last Year: Never true  Transportation Needs: No Transportation Needs (04/20/2022)   PRAPARE - Hydrologist (Medical): No    Lack of Transportation (Non-Medical): No  Physical Activity: Not on file  Stress: Not on file  Social Connections: Not on file     Family History: The patient's ***family history is not on file.  ROS:   Please see the history of present illness.    *** All other systems reviewed and are negative.  EKGs/Labs/Other Studies Reviewed:    The following studies were reviewed today: ***  EKG:  EKG is *** ordered today.  The ekg ordered today demonstrates ***  CT Cardiac Scoring ordered   2D echo complete on April 21, 2022: 1. Left ventricular ejection fraction, by estimation, is 55%. The left  ventricle has normal function. The left ventricle has no regional wall  motion abnormalities but there was septal bounce suggestive of  interventricular conduction delay. Left  ventricular diastolic parameters were normal.   2. Right ventricular systolic function is normal. The right ventricular  size is normal. Tricuspid regurgitation signal is inadequate for assessing  PA pressure.   3. The mitral valve is normal in structure. No evidence of mitral valve  regurgitation. No evidence of mitral stenosis.   4. The aortic valve is tricuspid. Aortic valve regurgitation is not  visualized. No aortic stenosis is present.   5. The inferior vena cava is normal in size with greater than 50%  respiratory variability, suggesting right atrial pressure of 3 mmHg.    Venous US of left Upper extremity on  04/19/22: No evidence of DVT within left upper extremity  CT Angio of Chest/Abdomen/Pelvis for dissection on 04/19/22:  1. Masslike consolidation of the RIGHT UPPER lobe and RIGHT middle lobe, with early cavitation present. The involved area is estimated to measure 9.1 x 10.1 x 5.4 centimeters. Although the findings are suspicious for malignancy, the rapid change since prior chest x-ray performed on 09/08/2021 suggest infectious/inflammatory causes. Necrotizing infection should be excluded. 2. Discrete 5 millimeter ground-glass pulmonary nodule in the RIGHT LOWER lobe. 3. Mediastinal and RIGHT hilar adenopathy. 4. No evidence for aortic dissection or aneurysm. 5. Possible thrombus within the LEFT internal jugular, subclavian vein, and superior vena cava. Recommend further evaluation with LEFT UPPER extremity venous Doppler exam. 6. Bilateral renal cysts. No follow-up imaging is recommended. JACR 2018 Feb; 264-273, Management of the Incidental RenalMass on CT, RadioGraphics 2021; 814-848, Bosniak Classification of Cystic Renal Masses, Version 2019. 7. Normal appendix. 8. Small fat containing bilateral inguinal hernias. 9. Multiple thyroid nodules, the largest measuring 1.7 centimeters. Recommend non-emergent thyroid ultrasound. Reference: J Am Coll Radiol. 2015 Feb;12(2): 143-50     1 view CXR on 04/19/22: Confluent right lower lobe opacity raising concern for pneumonia with small parapneumonic effusion. Recommend follow-up radiographs in 3-4 weeks to ensure resolution.  Recent Labs: 04/19/2022: ALT 25; B Natriuretic Peptide 165.3 04/21/2022: Magnesium 1.7; TSH 3.039 04/22/2022: Hemoglobin 12.5; Platelets 197 04/23/2022: BUN 10; Creatinine, Ser 0.78; Potassium 3.7; Sodium 135  Recent Lipid Panel No results found for: "CHOL", "TRIG", "HDL", "CHOLHDL", "VLDL", "LDLCALC", "LDLDIRECT"   Risk Assessment/Calculations:   {Does this patient have ATRIAL FIBRILLATION?:[id number]}  No BP  recorded.  {Refresh Note OR Click here to enter BP  :1}***         Physical Exam:    VS:  There were no vitals taken for this visit.    Wt Readings from Last 3 Encounters:  05/18/22 168 lb 9.6 oz (76.5 kg)  04/19/22 173 lb (78.5 kg)  09/08/21 187 lb 6.3 oz (85 kg)     GEN: *** Well nourished, well developed in no acute distress HEENT: Normal NECK: No JVD; No carotid bruits LYMPHATICS: No lymphadenopathy CARDIAC: ***RRR, no murmurs, rubs, gallops RESPIRATORY:  Clear to auscultation without rales, wheezing or rhonchi  ABDOMEN: Soft, non-tender, non-distended MUSCULOSKELETAL:  No edema; No deformity  SKIN: Warm and dry NEUROLOGIC:  Alert and oriented x 3 PSYCHIATRIC:  Normal affect   ASSESSMENT:    No diagnosis found. PLAN:    In order of problems listed above:  ***      {Are you ordering a CV Procedure (e.g. stress test, cath, DCCV, TEE, etc)?   Press F2        :YC:[id number]    Medication Adjustments/Labs and Tests Ordered: Current medicines are reviewed at length with the patient today.  Concerns regarding medicines are outlined  above.  No orders of the defined types were placed in this encounter.  No orders of the defined types were placed in this encounter.   There are no Patient Instructions on file for this visit.   Signed, Finis Bud, NP  05/23/2022 8:42 PM    [place]

## 2022-05-24 ENCOUNTER — Ambulatory Visit: Payer: Self-pay | Attending: Physician Assistant | Admitting: Physician Assistant

## 2022-05-25 ENCOUNTER — Ambulatory Visit (HOSPITAL_COMMUNITY): Payer: Self-pay | Attending: Pulmonary Disease

## 2022-08-28 IMAGING — DX DG CHEST 1V PORT
1 series · 1 of 1 positions shown · non-contrast
Comparison: None.

CLINICAL DATA: chest pain

EXAM:
PORTABLE CHEST 1 VIEW

[chest pa]
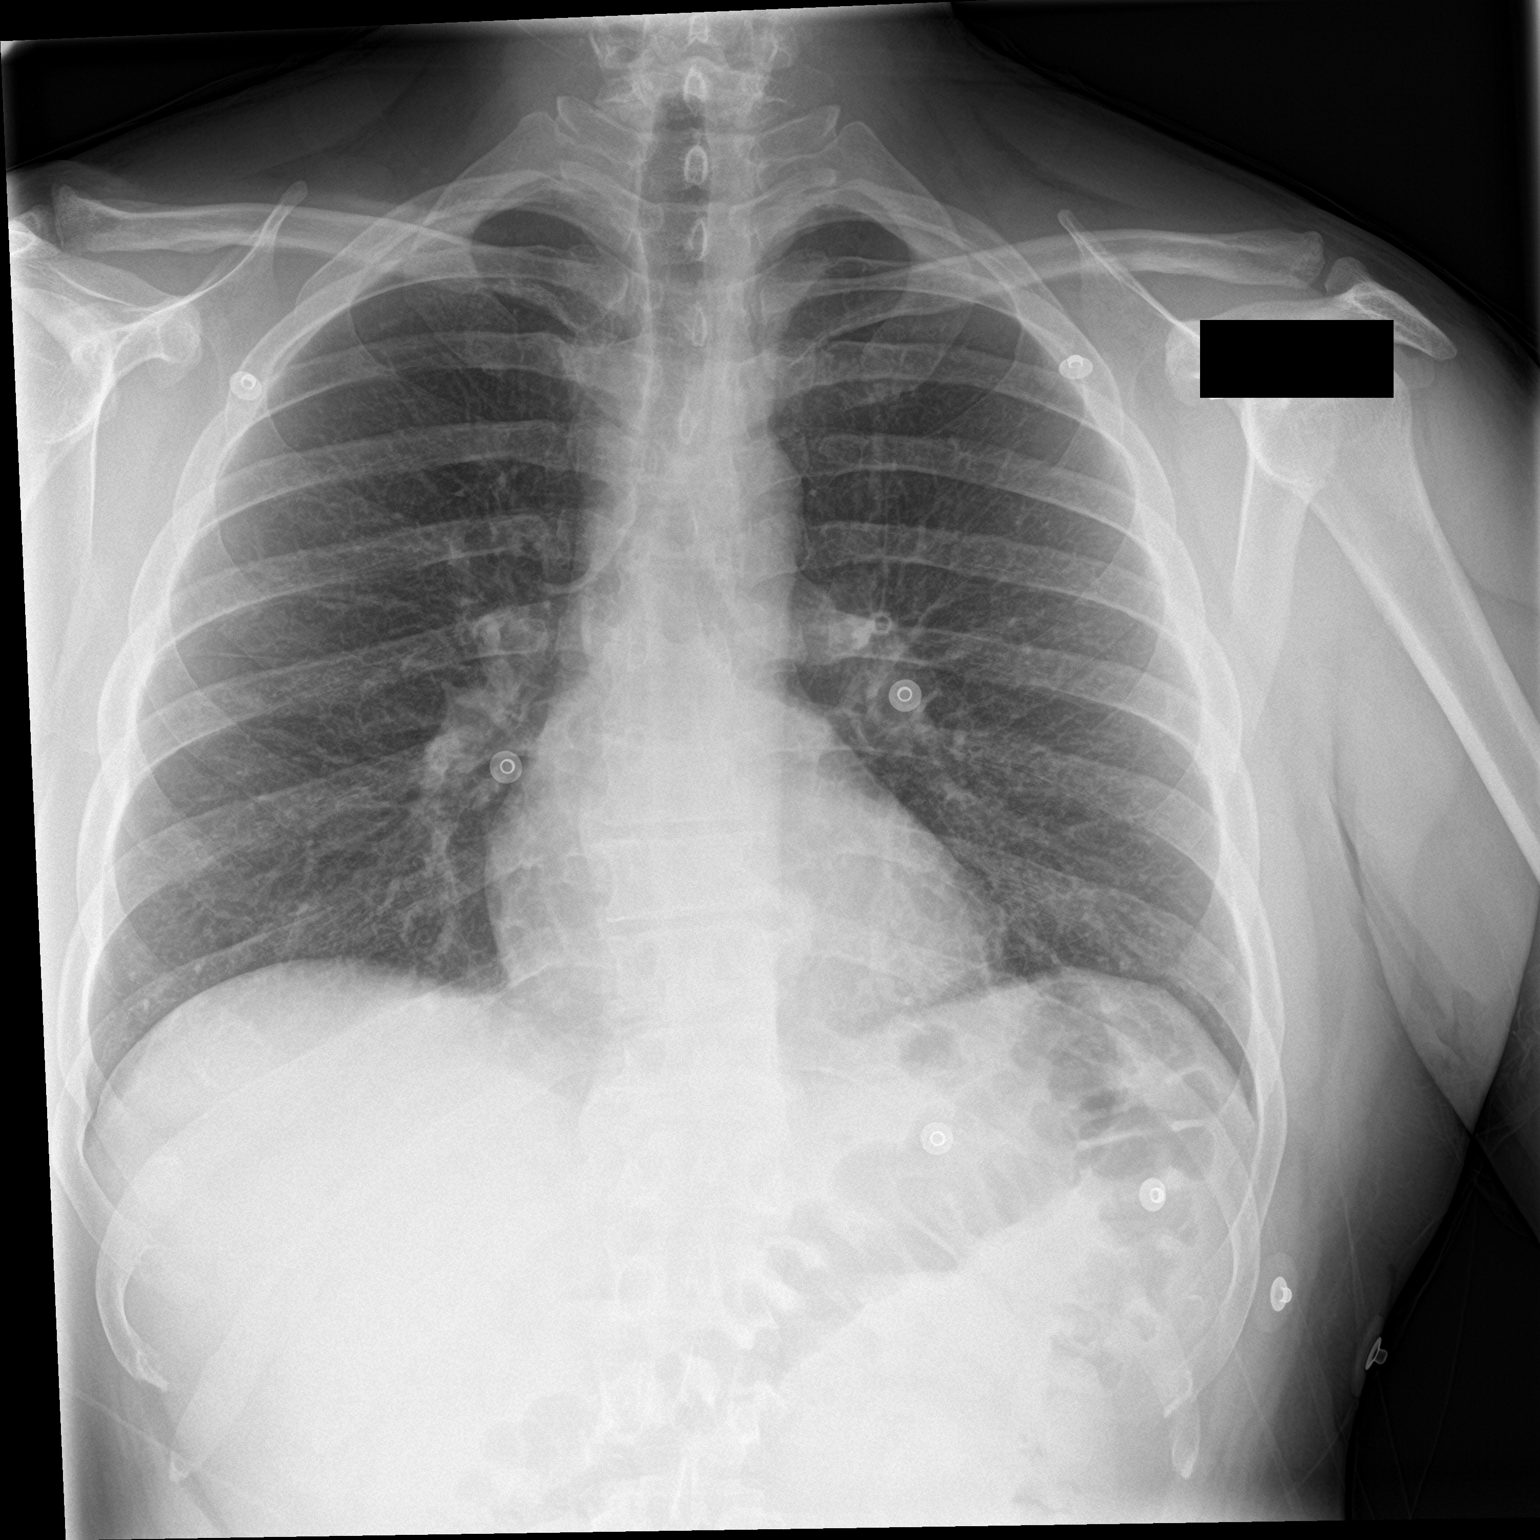

[1 of 1 positions shown; findings below may reference images not displayed]

FINDINGS: The heart and mediastinal contours are within normal limits.

Query vague retrocardiac airspace opacity. No pulmonary edema. No
pleural effusion. No pneumothorax.

No acute osseous abnormality.
IMPRESSION: Query vague retrocardiac airspace opacity. Followup PA and lateral
chest X-ray is recommended in 3-4 weeks following therapy to ensure
resolution and exclude underlying malignancy.

## 2023-01-26 ENCOUNTER — Emergency Department (HOSPITAL_COMMUNITY)
Admission: EM | Admit: 2023-01-26 | Discharge: 2023-01-26 | Disposition: A | Discharge status: Home / Self Care | Payer: Self-pay | Attending: Emergency Medicine | Admitting: Emergency Medicine

## 2023-01-26 ENCOUNTER — Other Ambulatory Visit: Payer: Self-pay

## 2023-01-26 ENCOUNTER — Encounter (HOSPITAL_COMMUNITY): Payer: Self-pay | Admitting: Emergency Medicine

## 2023-01-26 DIAGNOSIS — E860 Dehydration: Secondary | ICD-10-CM | POA: Insufficient documentation

## 2023-01-26 LAB — CBC
HCT: 43.1 % (ref 39.0–52.0)
Hemoglobin: 14.5 g/dL (ref 13.0–17.0)
MCH: 30.3 pg (ref 26.0–34.0)
MCHC: 33.6 g/dL (ref 30.0–36.0)
MCV: 90 fL (ref 80.0–100.0)
Platelets: 260 10*3/uL (ref 150–400)
RBC: 4.79 MIL/uL (ref 4.22–5.81)
RDW: 13 % (ref 11.5–15.5)
WBC: 10.8 10*3/uL — ABNORMAL HIGH (ref 4.0–10.5)
nRBC: 0 % (ref 0.0–0.2)

## 2023-01-26 LAB — BASIC METABOLIC PANEL
Anion gap: 12 (ref 5–15)
BUN: 9 mg/dL (ref 6–20)
CO2: 22 mmol/L (ref 22–32)
Calcium: 9.8 mg/dL (ref 8.9–10.3)
Chloride: 101 mmol/L (ref 98–111)
Creatinine, Ser: 1.24 mg/dL (ref 0.61–1.24)
GFR, Estimated: >60 mL/min (ref >60)
Glucose, Bld: 91 mg/dL (ref 70–99)
Potassium: 3.5 mmol/L (ref 3.5–5.1)
Sodium: 135 mmol/L (ref 135–145)

## 2023-01-26 LAB — CK: Total CK: 121 U/L (ref 49–397)

## 2023-01-26 NOTE — Discharge Instructions (Addendum)
Please follow-up with the primary care doctor I continued the information for 1.  Return to the ER for any new or concerning symptoms including any episodes where you pass out.

## 2023-01-26 NOTE — ED Provider Notes (Signed)
[place] EMERGENCY DEPARTMENT AT [facility] Provider Note   CSN: [id number] Arrival date & time: 01/26/23  1625     History  Chief Complaint  Patient presents with   Cramping   Dehydration    Scott Carrillo is a 47 y.o. male.  HPI  Patient is a 47 year old male with no pertinent past medical history denies any history of heart disease, arrhythmias, stroke.   Patient presents emergency room today with near syncopal episode that occurred prior to arrival.  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  He states he did not drink very much water today maybe 1 or 2 small cups of water.    He states he became more more fatigued and felt weak enough that he had to lay down however had to continue working.  He states that he started having some cramping in his arms, legs and abdomen.  States he urinated once but unsure what the color of the urine was.  States he only drink a little bit of water at work.  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  Concerned he may be dehydrated.  Received 600 mL of normal saline by EMS and stated his symptoms resolved.        Home Medications Prior to Admission medications   Medication Sig Start Date End Date Taking? Authorizing Provider  Menthol, Topical Analgesic, (ICY HOT ORIGINAL PAIN RELIEF EX) Apply 1 application  topically 2 (two) times daily as needed (pain). Icy Hot roll on    [provider]  metoprolol succinate (TOPROL-XL) 25 MG 24 hr tablet Take 0.5 tablets (12.5 mg total) by mouth daily. 04/24/22   Burnadette Pop, MD  naproxen sodium (ALEVE) 220 MG tablet Take 660 mg by mouth daily as needed (pain).    [provider]  oxyCODONE-acetaminophen (PERCOCET) 5-325 MG tablet Take 1-2 tablets by mouth every 4 (four) hours as needed. Patient taking differently: Take 1 tablet by mouth daily as needed (pain). 09/09/21    Gilda Crease, MD      Allergies    Chocolate, Penicillins, and Bee venom    Review of Systems   Review of Systems  Physical Exam Updated Vital Signs BP 122/79 (BP Location: Left Arm)   Pulse 76   Temp 97.9 F (36.6 C)   Resp 18   SpO2 100%  Physical Exam Vitals and nursing note reviewed.  Constitutional:      General: He is not in acute distress. HENT:     Head: Normocephalic and atraumatic.     Nose: Nose normal.  Eyes:     General: No scleral icterus. Cardiovascular:     Rate and Rhythm: Normal rate and regular rhythm.     Pulses: Normal pulses.     Heart sounds: Normal heart sounds.  Pulmonary:     Effort: Pulmonary effort is normal. No respiratory distress.     Breath sounds: No wheezing.  Abdominal:     Palpations: Abdomen is soft.     Tenderness: There is no abdominal tenderness.  Musculoskeletal:     Cervical back: Normal range of motion.     Right lower leg: No edema.     Left lower leg: No edema.  Skin:    General: Skin is warm and dry.     Capillary Refill: Capillary refill takes less than 2 seconds.  Neurological:  Mental Status: He is alert. Mental status is at baseline.  Psychiatric:        Mood and Affect: Mood normal.        Behavior: Behavior normal.     ED Results / Procedures / Treatments   Labs (all labs ordered are listed, but only abnormal results are displayed) Labs Reviewed  CBC - Abnormal; Notable for the following components:      Result Value   WBC 10.8 (*)    All other components within normal limits  BASIC METABOLIC PANEL  CK  URINALYSIS, ROUTINE W REFLEX MICROSCOPIC    EKG None  Radiology No results found.  Procedures Procedures    Medications Ordered in ED Medications - No data to display  ED Course/ Medical Decision Making/ A&P                             Medical Decision Making  Patient is a 47 year old male with no pertinent past medical history denies any history of heart disease,  arrhythmias, stroke.   Patient presents emergency room today with near syncopal episode that occurred prior to arrival.  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  He states he did not drink very much water today maybe 1 or 2 small cups of water.    He states he became more more fatigued and felt weak enough that he had to lay down however had to continue working.  He states that he started having some cramping in his arms, legs and abdomen.  States he urinated once but unsure what the color of the urine was.  States he only drink a little bit of water at work.  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  Concerned he may be dehydrated.  Received 600 mL of normal saline by EMS and stated his symptoms resolved.   Labs normal here, he has no symptoms currently.  CK normal.  Tolerating p.o.  Offered IV fluids which she declined.  He feels much improved and would like to go home.  Will discharge him at this time.  Return precautions discussed.  He will follow-up with primary care.  Final Clinical Impression(s) / ED Diagnoses Final diagnoses:  Dehydration    Rx / DC Orders ED Discharge Orders     None         Gailen Shelter, [place] 01/27/23 0053    Loetta Rough, MD 01/27/23 [phone]

## 2023-01-26 NOTE — ED Provider Triage Note (Addendum)
Emergency Medicine Provider Triage Evaluation Note  Scott Carrillo , a 47 y.o. male  was evaluated in triage.  Pt complains of cramping and dehydration. Symptoms started at noon today.  States he started cramping in his arms and then his legs.  States he urinated once but unsure what the color of the urine was.  States he only drink a little bit of water at work.  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  Concerned he may be dehydrated.  Received 600 mL of normal saline by EMS and stated his symptoms did not improve.  Review of Systems  Positive: See above Negative: See above  Physical Exam  BP 119/80 (BP Location: Right Arm)   Pulse 83   Temp 98.4 F (36.9 C) (Oral)   Resp 16   SpO2 96%  Gen:   Awake, no distress   Resp:  Normal effort  MSK:   Moves extremities without difficulty  Other:    Medical Decision Making  Medically screening exam initiated at 4:33 PM.  Appropriate orders placed.  Wyn Quaker was informed that the remainder of the evaluation will be completed by another provider, this initial triage assessment does not replace that evaluation, and the importance of remaining in the ED until their evaluation is complete.  Work up started   Gareth Eagle, PA-C 01/26/23 1635

## 2023-01-26 NOTE — ED Notes (Signed)
Discharge instructions discussed with pt. Verbalized understanding. VSS. No questions or concerns regarding discharge  

## 2023-01-26 NOTE — ED Triage Notes (Signed)
Pt BIB EMS from work.  Works in a non [employer].  Experienced cramping from feet up legs and into torso and down arms/hands.  Pt had 600 mL NS with EMS and did improve.  Pt states he only had 2 bottles of water today.

## 2023-04-09 ENCOUNTER — Encounter (HOSPITAL_BASED_OUTPATIENT_CLINIC_OR_DEPARTMENT_OTHER): Payer: Self-pay

## 2023-04-09 ENCOUNTER — Emergency Department (HOSPITAL_BASED_OUTPATIENT_CLINIC_OR_DEPARTMENT_OTHER)
Admission: EM | Admit: 2023-04-09 | Discharge: 2023-04-09 | Disposition: A | Discharge status: Home / Self Care | Payer: Self-pay | Attending: Emergency Medicine | Admitting: Emergency Medicine

## 2023-04-09 ENCOUNTER — Other Ambulatory Visit: Payer: Self-pay

## 2023-04-09 DIAGNOSIS — M25532 Pain in left wrist: Secondary | ICD-10-CM | POA: Insufficient documentation

## 2023-04-09 DIAGNOSIS — M25531 Pain in right wrist: Secondary | ICD-10-CM | POA: Insufficient documentation

## 2023-04-09 NOTE — Discharge Instructions (Signed)
Please follow-up with primary care provider attached your for you today.  Today your exam is reassuring and you most likely have repetitive injury to your wrist that should resolve with anti-inflammatories, rest, ice, wrist brace.  You are cleared to go back to work when you feel ready.  If symptoms change or worsen please return to ER.

## 2023-04-09 NOTE — ED Provider Notes (Signed)
[place] EMERGENCY DEPARTMENT AT [facility] Provider Note   CSN: [id number] Arrival date & time: 04/09/23  1324     History  Chief Complaint  Patient presents with   Hand Pain    Scott Carrillo is a 47 y.o. male presenting with bilateral wrist pain for the past week.  Patient states that he recently got a new job at [employer] in which she does repetitive movement which he picks up would has to hold with an outstretched arm and shoulder into the machine all day.  Patient states that he has not worked for the past 2 days in the wrist pain is gotten better.  Patient denies new onset weakness, skin color changes, swelling.  Patient is been taking Aleve which has been helping.  Patient is here for work note to go back to work.  Patient denies any trauma  Home Medications Prior to Admission medications   Medication Sig Start Date End Date Taking? Authorizing Provider  Menthol, Topical Analgesic, (ICY HOT ORIGINAL PAIN RELIEF EX) Apply 1 application  topically 2 (two) times daily as needed (pain). Icy Hot roll on    [provider]  metoprolol succinate (TOPROL-XL) 25 MG 24 hr tablet Take 0.5 tablets (12.5 mg total) by mouth daily. 04/24/22   Burnadette Pop, MD  naproxen sodium (ALEVE) 220 MG tablet Take 660 mg by mouth daily as needed (pain).    [provider]  oxyCODONE-acetaminophen (PERCOCET) 5-325 MG tablet Take 1-2 tablets by mouth every 4 (four) hours as needed. Patient taking differently: Take 1 tablet by mouth daily as needed (pain). 09/09/21   Gilda Crease, MD      Allergies    Chocolate, Penicillins, and Bee venom    Review of Systems   Review of Systems  Physical Exam Updated Vital Signs BP 132/89   Pulse 73   Temp 98.2 F (36.8 C) (Oral)   Resp 17   Ht 5\' 3"  (1.6 m)   Wt 80.7 kg   SpO2 99%   BMI 31.53 kg/m  Physical Exam Constitutional:      General: He is not in acute distress. Cardiovascular:     Rate and  Rhythm: Normal rate.     Pulses: Normal pulses.  Musculoskeletal:     Comments: Bilateral wrist: 5 out of 5 wrist extension/flexion, no edema noted, no scaphoid tenderness, 5/5 bilateral grip strength no obvious deformities, soft compartments, pain not out of proportion, negative Phalen/Tinel signs  Skin:    General: Skin is warm and dry.     Capillary Refill: Capillary refill takes less than 2 seconds.  Neurological:     Mental Status: He is alert.     Comments: Sensation intact distally     ED Results / Procedures / Treatments   Labs (all labs ordered are listed, but only abnormal results are displayed) Labs Reviewed - No data to display  EKG None  Radiology No results found.  Procedures Procedures    Medications Ordered in ED Medications - No data to display  ED Course/ Medical Decision Making/ A&P                                 Medical Decision Making  Scott Carrillo 47 y.o. presented today for bilateral wrist pain. Working DDx that I considered at this time includes, but not limited to, jackhammer syndrome, carpal tunnel, contusion, strain/sprain, fracture, dislocation, neurovascular compromise,  septic joint, ischemic limb, compartment syndrome.  R/o DDx: carpal tunnel, contusion, strain/sprain, fracture, dislocation, neurovascular compromise, septic joint, ischemic limb, compartment syndrome: These are considered less likely due to history of present illness, physical exam, labs/imaging findings.  Review of prior external notes: 01/26/2023 ED  Unique Tests and My Interpretation: None  Discussion with Independent Historian: None  Discussion of Management of Tests: None  Risk: Low: based on diagnostic testing/clinical impression and treatment plan  Risk Stratification Score: None  Plan: On exam patient was in no acute distress with stable vitals.  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  Patient will be given wrist braces and a work note at his request to go back to work.  I encouraged patient to continue doing his anti-inflammatories and ice his wrist.  Patient was also given primary care follow-up as he does not have a primary care provider.  I offered x-rays for the patient to rule out any bony pathology however patient states that this time he does not believe x-rays are necessary and only wants the wrist braces with a work note.  At this time it seems to be a reasonable request and we will forego x-rays at this time.  Patient was given return precautions. Patient stable for discharge at this time.  Patient verbalized understanding of plan.         Final Clinical Impression(s) / ED Diagnoses Final diagnoses:  Pain in both wrists    Rx / DC Orders ED Discharge Orders     None         Netta Corrigan, PA-C 04/09/23 1101    Elayne Snare K, DO 04/09/23 1124

## 2023-04-09 NOTE — ED Triage Notes (Signed)
Pt presents pov with complaints of bilateral hand pain. Pt states he started a new job x month but was in orientation. He started working on the machine x 3days and has had the pain since then.

## 2023-06-02 ENCOUNTER — Emergency Department (HOSPITAL_COMMUNITY)
Admission: EM | Admit: 2023-06-02 | Discharge: 2023-06-02 | Disposition: A | Discharge status: Home / Self Care | Payer: Self-pay

## 2023-06-02 ENCOUNTER — Encounter (HOSPITAL_COMMUNITY): Payer: Self-pay | Admitting: Emergency Medicine

## 2023-06-02 ENCOUNTER — Other Ambulatory Visit: Payer: Self-pay

## 2023-06-02 DIAGNOSIS — M25512 Pain in left shoulder: Secondary | ICD-10-CM | POA: Insufficient documentation

## 2023-06-02 DIAGNOSIS — M542 Cervicalgia: Secondary | ICD-10-CM | POA: Insufficient documentation

## 2023-06-02 DIAGNOSIS — Z720 Tobacco use: Secondary | ICD-10-CM | POA: Insufficient documentation

## 2023-06-02 DIAGNOSIS — I4891 Unspecified atrial fibrillation: Secondary | ICD-10-CM | POA: Insufficient documentation

## 2023-06-02 DIAGNOSIS — G8929 Other chronic pain: Secondary | ICD-10-CM

## 2023-06-02 MED ORDER — CYCLOBENZAPRINE HCL 10 MG PO TABS: 10.0000 mg | ORAL_TABLET | Freq: Two times a day (BID) | ORAL | 0 refills | Status: AC | PRN

## 2023-06-02 NOTE — Discharge Instructions (Signed)
Please see work note.  Please begin taking muscle relaxer twice a day as needed.  Please be aware that this will cause you to become drowsy do not drive or operate heavy machinery.  Please continue taking Tylenol or ibuprofen at home for pain.

## 2023-06-02 NOTE — ED Provider Notes (Signed)
[facility] EMERGENCY DEPARTMENT AT [facility] Provider Note   CSN: [id number] Arrival date & time: 06/02/23  1006     History  Chief Complaint  Patient presents with   Neck Pain    Scott Carrillo is a 47 y.o. male with medical history of A-fib RVR, tobacco use, lung nodules.  Patient presents to ED for evaluation of left-sided shoulder pain.  The patient states that he works in a [employer].  States that he has had left-sided shoulder pain and left-sided neck pain for the last 1 year which he attributes to muscle strain.  States that he was at work today when he had a sudden onset of this pain and reached up to grab his left shoulder.  Denies any trauma to his left shoulder.  Denies ever being evaluated for this.  Reports that his boss saw him doing this and insisted that he come in for evaluation and to receive a work note stating he could return to work.  He reports his pain is typically well-controlled with a combination of Tylenol, IcyHot and ibuprofen.  He denies any new features to his neck pain that has been present for the last 1 year.  Denies any fevers, weakness, numbness or tingling into his left upper extremity.  Denies any neck stiffness.   Neck Pain      Home Medications Prior to Admission medications   Medication Sig Start Date End Date Taking? Authorizing Provider  cyclobenzaprine (FLEXERIL) 10 MG tablet Take 1 tablet (10 mg total) by mouth 2 (two) times daily as needed for muscle spasms. 06/02/23  Yes Al Decant, PA-C  Menthol, Topical Analgesic, (ICY HOT ORIGINAL PAIN RELIEF EX) Apply 1 application  topically 2 (two) times daily as needed (pain). Icy Hot roll on    [provider]  metoprolol succinate (TOPROL-XL) 25 MG 24 hr tablet Take 0.5 tablets (12.5 mg total) by mouth daily. 04/24/22   Burnadette Pop, MD  naproxen sodium (ALEVE) 220 MG tablet Take 660 mg by mouth daily as needed (pain).    [provider]   oxyCODONE-acetaminophen (PERCOCET) 5-325 MG tablet Take 1-2 tablets by mouth every 4 (four) hours as needed. Patient taking differently: Take 1 tablet by mouth daily as needed (pain). 09/09/21   Gilda Crease, MD      Allergies    Chocolate, Penicillins, and Bee venom    Review of Systems   Review of Systems  Musculoskeletal:  Positive for arthralgias and neck pain.  All other systems reviewed and are negative.   Physical Exam Updated Vital Signs BP (!) 142/94 (BP Location: Right Arm)   Pulse 81   Temp 98.1 F (36.7 C)   Resp 16   SpO2 98%  Physical Exam Vitals and nursing note reviewed.  Constitutional:      General: He is not in acute distress.    Appearance: Normal appearance. He is not ill-appearing, toxic-appearing or diaphoretic.  HENT:     Head: Normocephalic and atraumatic.     Nose: Nose normal.     Mouth/Throat:     Mouth: Mucous membranes are moist.     Pharynx: Oropharynx is clear.  Eyes:     Extraocular Movements: Extraocular movements intact.     Conjunctiva/sclera: Conjunctivae normal.     Pupils: Pupils are equal, round, and reactive to light.  Neck:   Cardiovascular:     Rate and Rhythm: Normal rate and regular rhythm.  Pulmonary:  Effort: Pulmonary effort is normal.     Breath sounds: Normal breath sounds. No wheezing.  Abdominal:     General: Abdomen is flat. Bowel sounds are normal.     Palpations: Abdomen is soft.     Tenderness: There is no abdominal tenderness.  Musculoskeletal:     Left shoulder: Tenderness present. Normal range of motion.     Cervical back: Normal range of motion and neck supple.     Comments: Full range of motion of left shoulder.  No overlying skin change.  No deformity.  No centralized cervical spinal tenderness.  Skin:    General: Skin is warm and dry.     Capillary Refill: Capillary refill takes less than 2 seconds.  Neurological:     Mental Status: He is alert and oriented to person, place, and time.      GCS: GCS eye subscore is 4. GCS verbal subscore is 5. GCS motor subscore is 6.     Cranial Nerves: Cranial nerves 2-12 are intact. No cranial nerve deficit.     Sensory: Sensation is intact. No sensory deficit.     Motor: Motor function is intact. No weakness.     Comments: 5 out of 5 strength to left upper extremity     ED Results / Procedures / Treatments   Labs (all labs ordered are listed, but only abnormal results are displayed) Labs Reviewed - No data to display  EKG None  Radiology No results found.  Procedures Procedures   Medications Ordered in ED Medications - No data to display  ED Course/ Medical Decision Making/ A&P    Medical Decision Making  29 show male presents for chronic left-sided neck and shoulder pain.  Please see HPI for further details.  On exam patient is afebrile, nontachycardic.  Lung sounds are clear bilaterally and he is not hypoxic.  Abdomen is soft and compressible throughout.  Neuroexam shows 5 out of 5 strength to his left upper extremity.  No centralized cervical spinal tenderness.  Patient was offered x-ray, Toradol and further workup.  Patient deferred on this at this time.  Patient states that he just needs clearance to return to work.  He has full range of motion of his left shoulder.  He has 5 out of 5 grip strength.  He reports that this has been an ongoing problem for the last 1 year and reports that this morning his pain just had a sudden increase and this is what caused him to grab his left shoulder.  He denies wishing to receive any workup.  He is just requesting a work note to return to work.  I see no reason as to why this patient cannot return to work.  He has good strength, states is been ongoing for over 1 year.  Denies any new changes to his chronic neck and shoulder pain.  Work note written.  Will also write him muscle relaxers.  Advised to follow-up with PCP.  Stable to discharge.  Final Clinical Impression(s) / ED  Diagnoses Final diagnoses:  Chronic neck pain  Chronic left shoulder pain    Rx / DC Orders ED Discharge Orders          Ordered    cyclobenzaprine (FLEXERIL) 10 MG tablet  2 times daily PRN        06/02/23 1124              Al Decant, [place] 06/02/23 1124    Durwin Glaze, MD 06/02/23  1520  

## 2023-06-02 NOTE — ED Triage Notes (Signed)
Pt reports neck pain and left shoulder pain x 1 year. Pt reports he was sent by his work and was told he needs to get a work note to return to work. Pt has no other complaints at this time.

## 2024-01-03 ENCOUNTER — Other Ambulatory Visit: Payer: Self-pay

## 2024-01-03 ENCOUNTER — Emergency Department (HOSPITAL_COMMUNITY): Payer: Self-pay

## 2024-01-03 ENCOUNTER — Emergency Department (HOSPITAL_COMMUNITY)
Admission: EM | Admit: 2024-01-03 | Discharge: 2024-01-03 | Disposition: A | Discharge status: Home / Self Care | Payer: Self-pay | Attending: Emergency Medicine | Admitting: Emergency Medicine

## 2024-01-03 ENCOUNTER — Encounter (HOSPITAL_COMMUNITY): Payer: Self-pay

## 2024-01-03 DIAGNOSIS — K209 Esophagitis, unspecified without bleeding: Secondary | ICD-10-CM | POA: Insufficient documentation

## 2024-01-03 DIAGNOSIS — R0789 Other chest pain: Secondary | ICD-10-CM

## 2024-01-03 LAB — COMPREHENSIVE METABOLIC PANEL WITH GFR
ALT: 11 U/L (ref 0–44)
AST: 17 U/L (ref 15–41)
Albumin: 3.4 g/dL — ABNORMAL LOW (ref 3.5–5.0)
Alkaline Phosphatase: 41 U/L (ref 38–126)
Anion gap: 10 (ref 5–15)
BUN: 9 mg/dL (ref 6–20)
CO2: 21 mmol/L — ABNORMAL LOW (ref 22–32)
Calcium: 9 mg/dL (ref 8.9–10.3)
Chloride: 109 mmol/L (ref 98–111)
Creatinine, Ser: 0.79 mg/dL (ref 0.61–1.24)
GFR, Estimated: >60 mL/min (ref >60)
Glucose, Bld: 108 mg/dL — ABNORMAL HIGH (ref 70–99)
Potassium: 3.7 mmol/L (ref 3.5–5.1)
Sodium: 140 mmol/L (ref 135–145)
Total Bilirubin: 0.4 mg/dL (ref 0.0–1.2)
Total Protein: 6.3 g/dL — ABNORMAL LOW (ref 6.5–8.1)

## 2024-01-03 LAB — CBC WITH DIFFERENTIAL/PLATELET
Abs Immature Granulocytes: 0.02 10*3/uL (ref 0.00–0.07)
Basophils Absolute: 0.1 10*3/uL (ref 0.0–0.1)
Basophils Relative: 1 %
Eosinophils Absolute: 0.1 10*3/uL (ref 0.0–0.5)
Eosinophils Relative: 1 %
HCT: 42.1 % (ref 39.0–52.0)
Hemoglobin: 14.2 g/dL (ref 13.0–17.0)
Immature Granulocytes: 0 %
Lymphocytes Relative: 28 %
Lymphs Abs: 2.2 10*3/uL (ref 0.7–4.0)
MCH: 31.1 pg (ref 26.0–34.0)
MCHC: 33.7 g/dL (ref 30.0–36.0)
MCV: 92.1 fL (ref 80.0–100.0)
Monocytes Absolute: 0.4 10*3/uL (ref 0.1–1.0)
Monocytes Relative: 5 %
Neutro Abs: 5 10*3/uL (ref 1.7–7.7)
Neutrophils Relative %: 65 %
Platelets: 244 10*3/uL (ref 150–400)
RBC: 4.57 MIL/uL (ref 4.22–5.81)
RDW: 13.2 % (ref 11.5–15.5)
WBC: 7.7 10*3/uL (ref 4.0–10.5)
nRBC: 0 % (ref 0.0–0.2)

## 2024-01-03 LAB — TROPONIN I (HIGH SENSITIVITY)
Troponin I (High Sensitivity): <2 ng/L (ref <18)
Troponin I (High Sensitivity): <2 ng/L (ref <18)

## 2024-01-03 LAB — LIPASE, BLOOD: Lipase: 108 U/L — ABNORMAL HIGH (ref 11–51)

## 2024-01-03 LAB — D-DIMER, QUANTITATIVE: D-Dimer, Quant: <0.27 ug{FEU}/mL (ref 0.00–0.50)

## 2024-01-03 MED ORDER — IOHEXOL 350 MG/ML SOLN
75.0000 mL | Freq: Once | INTRAVENOUS | Status: AC | PRN
  Administered 2024-01-03: 75 mL via INTRAVENOUS

## 2024-01-03 MED ORDER — PANTOPRAZOLE SODIUM 40 MG PO TBEC: 40.0000 mg | DELAYED_RELEASE_TABLET | Freq: Every day | ORAL | 0 refills | Status: AC

## 2024-01-03 MED ORDER — PANTOPRAZOLE SODIUM 40 MG IV SOLR
80.0000 mg | Freq: Once | INTRAVENOUS | Status: AC
  Administered 2024-01-03: 80 mg via INTRAVENOUS
  Filled 2024-01-03: qty 20

## 2024-01-03 MED ORDER — SODIUM CHLORIDE 0.9 % IV BOLUS
500.0000 mL | Freq: Once | INTRAVENOUS | Status: AC
  Administered 2024-01-03: 500 mL via INTRAVENOUS

## 2024-01-03 MED ORDER — MAALOX MAX 400-400-40 MG/5ML PO SUSP: 10.0000 mL | Freq: Four times a day (QID) | ORAL | 0 refills | Status: AC | PRN

## 2024-01-03 MED ORDER — ALUM & MAG HYDROXIDE-SIMETH 200-200-20 MG/5ML PO SUSP
30.0000 mL | Freq: Once | ORAL | Status: AC
  Administered 2024-01-03: 30 mL via ORAL
  Filled 2024-01-03: qty 30

## 2024-01-03 NOTE — ED Triage Notes (Addendum)
 Pt here for cp/epigastric pain. Deep breathes made cp feel worst . Pt had syncopal episode, unknown if hit head, no obvious injuries to head. EMS gave 400 cc NS and 324mg  of aspirin . Axox4 Denies [facility]

## 2024-01-03 NOTE — ED Provider Notes (Signed)
 Emergency Department Provider Note   I have reviewed the triage vital signs and the nursing notes.   HISTORY  Chief Complaint Chest Pain   HPI Scott Carrillo is a 48 y.o. male with past history reviewed below presents emergency department for evaluation of chest pain with syncope this morning.  Patient got up and went to work.  He was feeling well at work but then developed familiar, central chest burning sensation.  He associates this with his reflux and largely ignored it.  His pain seemed to get worse and he noticed some increased pain with deep breathing.  He became suddenly very lightheaded and then had a syncope event at work.  EMS arrived to find him diaphoretic.  They administered 324 mg of aspirin  along with 400 mL of normal saline.  Patient continues to have some burning chest discomfort.  He does not feel particularly short of breath.  No severe abdominal pain.  No vomiting.   History reviewed. No pertinent past medical history.  Review of Systems  Constitutional: No fever/chills Cardiovascular: Positive chest pain and syncope.  Respiratory: Denies shortness of breath. Gastrointestinal: No abdominal pain.  No nausea, no vomiting.  Skin: Negative for rash. Neurological: Negative for headaches.  ____________________________________________   PHYSICAL EXAM:  VITAL SIGNS: Vitals:   01/03/24 1430 01/03/24 1457  BP: 114/76   Pulse: (!) 49   Resp: 18   Temp:  98.2 F (36.8 C)  SpO2: 96%     Constitutional: Alert and oriented. Appears slightly uncomfortable.  Eyes: Conjunctivae are normal.  Head: Atraumatic. Nose: No congestion/rhinnorhea. Mouth/Throat: Mucous membranes are moist.   Neck: No stridor.   Cardiovascular: Normal rate, regular rhythm. Good peripheral circulation. Grossly normal heart sounds.   Respiratory: Normal respiratory effort.  No retractions. Lungs CTAB. Gastrointestinal: Soft with epigastric tenderness. No peritonitis.  Musculoskeletal:  No  gross deformities of extremities. Neurologic:  Normal speech and language.  Skin:  Skin is warm, dry and intact. No rash noted.  ____________________________________________   LABS (all labs ordered are listed, but only abnormal results are displayed)  Labs Reviewed  COMPREHENSIVE METABOLIC PANEL WITH GFR - Abnormal; Notable for the following components:      Result Value   CO2 21 (*)    Glucose, Bld 108 (*)    Total Protein 6.3 (*)    Albumin 3.4 (*)    All other components within normal limits  LIPASE, BLOOD - Abnormal; Notable for the following components:   Lipase 108 (*)    All other components within normal limits  CBC WITH DIFFERENTIAL/PLATELET  D-DIMER, QUANTITATIVE  TROPONIN I (HIGH SENSITIVITY)  TROPONIN I (HIGH SENSITIVITY)   ____________________________________________  EKG   EKG Interpretation Date/Time:  Tuesday January 03 2024 11:23:22 EDT Ventricular Rate:  44 PR Interval:  169 QRS Duration:  122 QT Interval:  478 QTC Calculation: 409 R Axis:   78  Text Interpretation: Sinus bradycardia IVCD, consider atypical RBBB ST elev, probable normal early repol pattern Confirmed by Abby Hocking [phone]) on 01/03/2024 11:24:05 AM        ____________________________________________  RADIOLOGY  CT ABDOMEN PELVIS W CONTRAST Result Date: 01/03/2024 CLINICAL DATA:  Acute upper abdominal pain. EXAM: CT ABDOMEN AND PELVIS WITH CONTRAST TECHNIQUE: Multidetector CT imaging of the abdomen and pelvis was performed using the standard protocol following bolus administration of intravenous contrast. RADIATION DOSE REDUCTION: This exam was performed according to the departmental dose-optimization program which includes automated exposure control, adjustment of the mA and/or kV according  to patient size and/or use of iterative reconstruction technique. CONTRAST:  75mL OMNIPAQUE  IOHEXOL  350 MG/ML SOLN COMPARISON:  04/19/2022 FINDINGS: Lower chest: Previous consolidation at the right  lung base has completely resolved. There is some minimal dependent atelectasis in both lower lobes. 5 mm nodule posterolaterally in the posterior basal segment left lower lobe on image 20 series 5 faintly apparent on 04/19/2022. A separate 6 by 5 by 5 mm (volume = 80 mm^3) left lower lobe nodule in the posterior basal segment on image 27 series 5 was likewise present on 04/19/2022. accordingly these nodules are thought to be benign given the lack of progression. Circumferential distal esophageal wall thickening is nonspecific although esophagitis would be a common cause. Hepatobiliary: Contracted gallbladder.  Otherwise unremarkable. Pancreas: Unremarkable Spleen: Unremarkable Adrenals/Urinary Tract: Both adrenal glands appear normal. Benign bilateral renal cysts. Some hypodense renal lesions are technically too small to characterize, although statistically likely to be benign cysts. No further imaging workup of these lesions is indicated. Adrenal glands unremarkable.  Urinary bladder likewise unremarkable. 1-2 mm punctate left kidney lower pole nonobstructive renal calculus. Stomach/Bowel: Unremarkable.  Normal appendix. Vascular/Lymphatic: Atherosclerosis is present, including aortoiliac atherosclerotic disease. Reproductive: Mild calcifications along the seminal vesicles and in the transition zone. Other: No supplemental non-categorized findings. Musculoskeletal: Bilateral direct inguinal hernias contain adipose tissue. Suspected disc bulge and small central disc protrusion at L4-5. Left lateral recess annulus fibrosus calcification at L5-S1. IMPRESSION: 1. Circumferential distal esophageal wall thickening is nonspecific although esophagitis would be a common cause. 2. 1-2 mm punctate left kidney lower pole nonobstructive renal calculus. 3. Bilateral direct inguinal hernias contain adipose tissue. 4. Suspected disc bulge and small central disc protrusion at L4-5. Left lateral recess annulus fibrosus calcification  at L5-S1. 5. Two 5 mm in diameter left lower lobe pulmonary nodules are stable from 2023 and considered benign. No further imaging workup of these lesions is indicated. 6.  Aortic Atherosclerosis (ICD10-I70.0). Electronically Signed   By: Freida Jes M.D.   On: 01/03/2024 14:04   DG Chest 2 View Result Date: 01/03/2024 CLINICAL DATA:  Chest pain EXAM: CHEST - 2 VIEW COMPARISON:  12/09/2023 from [facility] FINDINGS: Numerous leads and wires project over the chest. Midline trachea. Normal heart size and mediastinal contours. No pleural effusion or pneumothorax. Clear lungs. IMPRESSION: No active cardiopulmonary disease. Electronically Signed   By: Lore Rode M.D.   On: 01/03/2024 10:02    ____________________________________________   PROCEDURES  Procedure(s) performed:   Procedures  None  ____________________________________________   INITIAL IMPRESSION / ASSESSMENT AND PLAN / ED COURSE  Pertinent labs & imaging results that were available during my care of the patient were reviewed by me and considered in my medical decision making (see chart for details).   This patient is Presenting for Evaluation of CP, which does require a range of treatment options, and is a complaint that involves a high risk of morbidity and mortality.  The Differential Diagnoses includes but is not exclusive to acute coronary syndrome, aortic dissection, pulmonary embolism, cardiac tamponade, community-acquired pneumonia, pericarditis, musculoskeletal chest wall pain, etc.   Critical Interventions-    Medications  sodium chloride  0.9 % bolus 500 mL (0 mLs Intravenous Stopped 01/03/24 1056)  alum & mag hydroxide-simeth (MAALOX/MYLANTA) 200-200-20 MG/5ML suspension 30 mL (30 mLs Oral Given 01/03/24 0848)  iohexol  (OMNIPAQUE ) 350 MG/ML injection 75 mL (75 mLs Intravenous Contrast Given 01/03/24 1206)  pantoprazole  (PROTONIX ) injection 80 mg (80 mg Intravenous Given 01/03/24 1454)  Reassessment after intervention: pain improved.    Clinical Laboratory Tests Ordered, included D-dimer normal.  Troponin normal.  No acute kidney injury.  LFTs, bilirubin normal.  Minimally elevated lipase to 108.  Radiologic Tests Ordered, included CXR. I independently interpreted the images and agree with radiology interpretation.   Cardiac Monitor Tracing which shows NSR.    Social Determinants of Health Risk patient is a smoker.   Medical Decision Making: Summary:  Patient presents emergency department with fairly atypical, burning chest pain although does describe a pleuritic component.  He also had a syncopal event.  While this could have been vasovagal plan for screening blood work including serial troponins and D-dimer.  Minimal epigastric tenderness.  Plan for Maalox.  He has received aspirin  prior to arrival.  Reevaluation with update and discussion with patient with bradycardia while sleeping.  No high-grade block.  No hypotension.  No altered mental status.  Heart rate improved with waking.  CT discussed with patient and wife at bedside.  Esophagitis found likely explaining symptoms.  Plan for Protonix  and GI referral.  Considered admission workup is largely unremarkable.   Patient's presentation is most consistent with acute presentation with potential threat to life or bodily function.   Disposition: discharge  ____________________________________________  FINAL CLINICAL IMPRESSION(S) / ED DIAGNOSES  Final diagnoses:  Atypical chest pain  Esophagitis     NEW OUTPATIENT MEDICATIONS STARTED DURING THIS VISIT:  Discharge Medication List as of 01/03/2024  2:57 PM     START taking these medications   Details  alum & mag hydroxide-simeth (MAALOX MAX) 400-400-40 MG/5ML suspension Take 10 mLs by mouth every 6 (six) hours as needed for indigestion., Starting Tue 01/03/2024, Normal    pantoprazole  (PROTONIX ) 40 MG tablet Take 1 tablet (40 mg total) by mouth daily.,  Starting Tue 01/03/2024, Until Thu 02/02/2024, Normal        Note:  This document was prepared using Dragon voice recognition software and may include unintentional dictation errors.  Abby Hocking, MD, [facility] Emergency Medicine    Birgitta Uhlir, Shereen Dike, MD 01/04/24 [phone]

## 2024-01-03 NOTE — ED Notes (Signed)
 CCMD called.

## 2024-01-03 NOTE — Discharge Instructions (Signed)

## 2024-01-03 NOTE — ED Notes (Signed)
 Alerted clinical staff to monitor alerts
